# Patient Record
Sex: Male | Born: 1935 | Race: White | Hispanic: No | Smoking: Never smoker
Health system: Southern US, Community
[De-identification: ages and names within clinical notes are randomized; demographics above are authoritative.]

## PROBLEM LIST (undated history)

## (undated) DIAGNOSIS — I4891 Unspecified atrial fibrillation: Secondary | ICD-10-CM

## (undated) DIAGNOSIS — F0390 Unspecified dementia without behavioral disturbance: Secondary | ICD-10-CM

## (undated) DIAGNOSIS — I639 Cerebral infarction, unspecified: Secondary | ICD-10-CM

## (undated) DIAGNOSIS — J449 Chronic obstructive pulmonary disease, unspecified: Secondary | ICD-10-CM

---

## 2000-10-08 ENCOUNTER — Ambulatory Visit (HOSPITAL_COMMUNITY): Admission: RE | Admit: 2000-10-08 | Discharge: 2000-10-09 | Payer: Self-pay | Admitting: Ophthalmology

## 2005-01-29 ENCOUNTER — Ambulatory Visit: Payer: Self-pay

## 2005-10-21 ENCOUNTER — Inpatient Hospital Stay: Payer: Self-pay | Admitting: Internal Medicine

## 2005-10-21 ENCOUNTER — Other Ambulatory Visit: Payer: Self-pay

## 2005-10-22 ENCOUNTER — Other Ambulatory Visit: Payer: Self-pay

## 2005-12-07 ENCOUNTER — Ambulatory Visit: Payer: Self-pay | Admitting: Internal Medicine

## 2006-02-13 ENCOUNTER — Ambulatory Visit: Payer: Self-pay | Admitting: Unknown Physician Specialty

## 2006-06-04 ENCOUNTER — Ambulatory Visit: Payer: Self-pay | Admitting: Internal Medicine

## 2006-12-09 ENCOUNTER — Ambulatory Visit: Payer: Self-pay | Admitting: Internal Medicine

## 2007-03-18 ENCOUNTER — Ambulatory Visit: Payer: Self-pay | Admitting: Unknown Physician Specialty

## 2007-05-05 ENCOUNTER — Ambulatory Visit: Payer: Self-pay | Admitting: Specialist

## 2007-09-12 ENCOUNTER — Ambulatory Visit: Payer: Self-pay | Admitting: Internal Medicine

## 2007-09-15 ENCOUNTER — Ambulatory Visit: Payer: Self-pay | Admitting: Specialist

## 2007-09-19 ENCOUNTER — Ambulatory Visit: Payer: Self-pay | Admitting: Specialist

## 2007-10-08 ENCOUNTER — Ambulatory Visit: Payer: Self-pay | Admitting: Cardiothoracic Surgery

## 2007-10-17 ENCOUNTER — Ambulatory Visit: Payer: Self-pay | Admitting: General Surgery

## 2007-10-17 ENCOUNTER — Other Ambulatory Visit: Payer: Self-pay

## 2007-10-22 ENCOUNTER — Inpatient Hospital Stay: Payer: Self-pay | Admitting: General Surgery

## 2007-11-17 ENCOUNTER — Ambulatory Visit: Payer: Self-pay | Admitting: Cardiology

## 2008-02-01 ENCOUNTER — Emergency Department: Payer: Self-pay | Admitting: Unknown Physician Specialty

## 2008-02-02 ENCOUNTER — Other Ambulatory Visit: Payer: Self-pay

## 2008-02-02 ENCOUNTER — Ambulatory Visit: Payer: Self-pay | Admitting: Internal Medicine

## 2008-03-16 ENCOUNTER — Ambulatory Visit: Payer: Self-pay | Admitting: Internal Medicine

## 2008-09-03 ENCOUNTER — Ambulatory Visit: Payer: Self-pay | Admitting: Unknown Physician Specialty

## 2008-09-17 ENCOUNTER — Ambulatory Visit: Payer: Self-pay | Admitting: Internal Medicine

## 2008-11-10 ENCOUNTER — Ambulatory Visit: Payer: Self-pay | Admitting: Unknown Physician Specialty

## 2009-02-16 ENCOUNTER — Ambulatory Visit: Payer: Self-pay | Admitting: Internal Medicine

## 2009-03-01 ENCOUNTER — Ambulatory Visit: Payer: Self-pay | Admitting: Vascular Surgery

## 2009-08-22 ENCOUNTER — Ambulatory Visit: Payer: Self-pay | Admitting: Internal Medicine

## 2010-03-02 ENCOUNTER — Ambulatory Visit: Payer: Self-pay | Admitting: Internal Medicine

## 2010-06-23 ENCOUNTER — Ambulatory Visit: Payer: Self-pay | Admitting: Specialist

## 2010-10-18 ENCOUNTER — Ambulatory Visit: Payer: Self-pay | Admitting: Specialist

## 2010-11-10 ENCOUNTER — Inpatient Hospital Stay: Payer: Self-pay | Admitting: Internal Medicine

## 2011-05-29 ENCOUNTER — Ambulatory Visit: Payer: Self-pay | Admitting: Specialist

## 2011-05-30 ENCOUNTER — Ambulatory Visit: Payer: Self-pay | Admitting: Internal Medicine

## 2011-07-19 ENCOUNTER — Emergency Department: Payer: Self-pay | Admitting: Emergency Medicine

## 2012-01-28 ENCOUNTER — Ambulatory Visit: Payer: Self-pay | Admitting: Internal Medicine

## 2012-04-23 ENCOUNTER — Encounter (HOSPITAL_COMMUNITY): Payer: Self-pay | Admitting: *Deleted

## 2012-04-23 ENCOUNTER — Emergency Department (HOSPITAL_COMMUNITY)
Admission: EM | Admit: 2012-04-23 | Discharge: 2012-04-23 | Disposition: A | Discharge status: Home / Self Care | Payer: Medicare Other | Attending: Emergency Medicine | Admitting: Emergency Medicine

## 2012-04-23 ENCOUNTER — Emergency Department (HOSPITAL_COMMUNITY): Payer: Medicare Other

## 2012-04-23 DIAGNOSIS — W230XXA Caught, crushed, jammed, or pinched between moving objects, initial encounter: Secondary | ICD-10-CM | POA: Insufficient documentation

## 2012-04-23 DIAGNOSIS — S51819A Laceration without foreign body of unspecified forearm, initial encounter: Secondary | ICD-10-CM

## 2012-04-23 DIAGNOSIS — S61209A Unspecified open wound of unspecified finger without damage to nail, initial encounter: Secondary | ICD-10-CM | POA: Insufficient documentation

## 2012-04-23 DIAGNOSIS — S61419A Laceration without foreign body of unspecified hand, initial encounter: Secondary | ICD-10-CM

## 2012-04-23 DIAGNOSIS — S51809A Unspecified open wound of unspecified forearm, initial encounter: Secondary | ICD-10-CM | POA: Insufficient documentation

## 2012-04-23 HISTORY — DX: Unspecified atrial fibrillation: I48.91

## 2012-04-23 LAB — PROTIME-INR
INR: 1 (ref 0.00–1.49)
Prothrombin Time: 13.4 seconds (ref 11.6–15.2)

## 2012-04-23 MED ORDER — HYDROCODONE-ACETAMINOPHEN 5-500 MG PO TABS: 1.0000 | ORAL_TABLET | Freq: Four times a day (QID) | ORAL | Status: AC | PRN

## 2012-04-23 MED ORDER — TETANUS-DIPHTH-ACELL PERTUSSIS 5-2.5-18.5 LF-MCG/0.5 IM SUSP: 0.5000 mL | Freq: Once | INTRAMUSCULAR | Status: DC

## 2012-04-23 MED ORDER — HYDROCODONE-ACETAMINOPHEN 5-325 MG PO TABS
1.0000 | ORAL_TABLET | Freq: Once | ORAL | Status: AC
Start: 2012-04-23 — End: 2012-04-23
  Administered 2012-04-23: 1 via ORAL
  Filled 2012-04-23: qty 1

## 2012-04-23 MED ORDER — CEPHALEXIN 500 MG PO CAPS: 500.0000 mg | ORAL_CAPSULE | Freq: Four times a day (QID) | ORAL | Status: AC

## 2012-04-23 NOTE — ED Notes (Signed)
PT returned from xray; wounds irrigated.

## 2012-04-23 NOTE — ED Notes (Signed)
Pt ambulated with a steady gait;VSS; A&Ox3; no signs of distress; respirations even and unlabored; skin warm and dry; no questions at this time.  

## 2012-04-23 NOTE — ED Provider Notes (Signed)
History     CSN: [id number]  Arrival date & time 04/23/12  2130   First MD Initiated Contact with Patient 04/23/12 1947      Chief Complaint  Patient presents with  . Hand Injury    (Consider location/radiation/quality/duration/timing/severity/associated sxs/prior treatment) HPI Comments: Hand in lawnmower.  On coumadin.  Otherwise doing well.  No other injury.  Patient is a 78 y.o. male presenting with hand injury. The history is provided by the patient.  Hand Injury  The incident occurred less than 1 hour ago. The incident occurred at home. Injury mechanism: hand caught under lawnmower while pt was starting mower. Pain location: right hand, forearm. The pain is moderate. The pain has been constant since the incident. Pertinent negatives include no fever.    Past Medical History  Diagnosis Date  . A-fib     History reviewed. No pertinent past surgical history.  History reviewed. No pertinent family history.  History  Substance Use Topics  . Smoking status: Not on file  . Smokeless tobacco: Not on file  . Alcohol Use:       Review of Systems  Constitutional: Negative for fever, activity change and fatigue.  HENT: Negative for congestion.   Eyes: Negative for pain.  Respiratory: Negative for chest tightness, shortness of breath, wheezing and stridor.   Cardiovascular: Negative for chest pain and leg swelling.  Genitourinary: Negative for dysuria.  Musculoskeletal: Negative for arthralgias.  Skin: Negative for rash.  Neurological: Negative for headaches.  Psychiatric/Behavioral: Negative for behavioral problems.    Allergies  Review of patient's allergies indicates no known allergies.  Home Medications   Current Outpatient Rx  Name Route Sig Dispense Refill  . ACETAMINOPHEN 500 MG PO TABS Oral Take 1,000 mg by mouth at bedtime as needed. For pain    . ALBUTEROL SULFATE HFA 108 (90 BASE) MCG/ACT IN AERS Inhalation Inhale 2 puffs into the lungs every 4 (four)  hours as needed. For shortness of breath    . ASPIRIN EC 81 MG PO TBEC Oral Take 81 mg by mouth daily.    . ATORVASTATIN CALCIUM 80 MG PO TABS Oral Take 40 mg by mouth daily.    Marland Kitchen DIGOXIN 0.125 MG PO TABS Oral Take 125 mcg by mouth daily.    Marland Kitchen DOXYCYCLINE MONOHYDRATE 100 MG PO TABS Oral Take 100 mg by mouth 2 (two) times daily.    Marland Kitchen FLUTICASONE PROPIONATE 50 MCG/ACT NA SUSP Nasal Place 2 sprays into the nose daily.    Marland Kitchen FLUTICASONE-SALMETEROL 250-50 MCG/DOSE IN AEPB Inhalation Inhale 1 puff into the lungs every 12 (twelve) hours.    . FUROSEMIDE 20 MG PO TABS Oral Take 20 mg by mouth daily as needed. For fluid retention    . HYDROCODONE-ACETAMINOPHEN 5-500 MG PO TABS Oral Take 1 tablet by mouth every 8 (eight) hours as needed. For pain    . LISINOPRIL 20 MG PO TABS Oral Take 20 mg by mouth daily.    Marland Kitchen OMEPRAZOLE 20 MG PO CPDR Oral Take 20 mg by mouth 2 (two) times daily.    Marland Kitchen POTASSIUM CHLORIDE CRYS ER 20 MEQ PO TBCR Oral Take 20 mEq by mouth daily as needed. When taking furosemide    . THEOPHYLLINE ER 300 MG PO TB12 Oral Take 300 mg by mouth 2 (two) times daily.    Marland Kitchen TIOTROPIUM BROMIDE MONOHYDRATE 18 MCG IN CAPS Inhalation Place 18 mcg into inhaler and inhale daily.    . TRAMADOL HCL 50 MG PO TABS  Oral Take 50 mg by mouth every 8 (eight) hours as needed. For pain    . WARFARIN SODIUM 3 MG PO TABS Oral Take 3 mg by mouth daily.    . CEPHALEXIN 500 MG PO CAPS Oral Take 1 capsule (500 mg total) by mouth 4 (four) times daily. 28 capsule 0  . HYDROCODONE-ACETAMINOPHEN 5-500 MG PO TABS Oral Take 1 tablet by mouth every 6 (six) hours as needed for pain. 15 tablet 0    BP 145/77  Pulse 74  Temp(Src) 98.9 F (37.2 C) (Oral)  Resp 17  SpO2 100%  Physical Exam  Constitutional: He is oriented to person, place, and time. He appears well-developed and well-nourished. No distress.  HENT:  Head: Normocephalic and atraumatic.  Eyes: Conjunctivae and EOM are normal. Pupils are equal, round, and  reactive to light. No scleral icterus.  Neck: Normal range of motion. Neck supple.  Pulmonary/Chest: Effort normal.  Abdominal: Soft. He exhibits no distension and no mass. There is no tenderness. There is no rebound and no guarding.  Musculoskeletal: Normal range of motion. He exhibits no edema and no tenderness.       Lac right middle finger, right 2nd finger.  Skin tears to right forearm.  Full Hand ROM.  Sensation intact throughout  Neurological: He is alert and oriented to person, place, and time. He has normal reflexes. No cranial nerve deficit. He exhibits normal muscle tone. Coordination normal.  Skin: Skin is warm and dry. No rash noted. He is not diaphoretic. No erythema.  Psychiatric: He has a normal mood and affect. His behavior is normal. Judgment and thought content normal.    ED Course  LACERATION REPAIR Date/Time: 04/23/2012 11:00 PM Performed by: Army Chaco Authorized by: Hurman Horn Consent: Verbal consent obtained. Risks and benefits: risks, benefits and alternatives were discussed Body area: upper extremity Location details: right long finger Laceration length: 5 cm Foreign body present: grass, dirt. Tendon involvement: none Nerve involvement: none Vascular damage: no Anesthesia: local infiltration Local anesthetic: lidocaine 1% without epinephrine (digital block) Irrigation solution: saline Irrigation method: jet lavage Amount of cleaning: extensive Debridement: moderate Degree of undermining: minimal Skin closure: 4-0 Prolene Number of sutures: 12 Technique: simple Approximation: close Approximation difficulty: simple Dressing: antibiotic ointment, gauze packing and 4x4 sterile gauze Patient tolerance: Patient tolerated the procedure well with no immediate complications.   (including critical care time)   Labs Reviewed  PROTIME-INR   Dg Hand Complete Right  04/23/2012  *RADIOLOGY REPORT*  Clinical Data: Multiple lacerations.  RIGHT HAND -  COMPLETE 3+ VIEW  Comparison: None.  Findings: No acute osseous abnormality.  Degenerative changes are seen at the first carpometacarpal and scaphoid trapezium trapezoid joints.  No radiopaque foreign body.  IMPRESSION:  1.  No fracture or radiopaque foreign body. 2.  Mild degenerative changes in the wrist.  Original Report Authenticated By: Reyes Ivan, M.D.     1. Laceration of hand   2. Skin tear of forearm without complication       MDM  Hand in lawnmower.  On coumadin.  Otherwise doing well.  No other injury.  N/V intact with full ROm in affected hand.  Xray without fx.  Lac repaired with sutures.  Skin tears closed with steristrips/gauze.  Pt to keep strips in place x 3 days.  PCP in 10 days for suture removal.  Keflex for prophylaxis.  Finger splint to allow for healing.  Pt comfortable with plan and will follow up.  Army Chaco, MD 04/23/12 2302

## 2012-04-23 NOTE — Discharge Instructions (Signed)

## 2012-04-23 NOTE — ED Notes (Signed)
Pt from home.  Has a hand injury from a lawnmower.  (R) hand injury and (R) FA injury.  Pt on blood thinners, bleeding controlled.  (R) middle and ring finger has lac to bone, sensation and movement presents.  Cap refill <3.  Vitals stable.  20Lwrist.

## 2012-04-23 NOTE — ED Notes (Signed)
Patient transported to X-ray 

## 2012-04-23 NOTE — ED Notes (Signed)
MD at bedside suturing finger lac.

## 2012-04-23 NOTE — ED Notes (Signed)
Ortho called 

## 2012-04-23 NOTE — Progress Notes (Signed)
Orthopedic Tech Progress Note Patient Details:  Troy Gonzales 16-Sep-1923 [id number]  Type of Splint: Finger Splint Location: right hand Splint Interventions: Application    [name] 04/23/2012, 11:07 PM

## 2012-04-26 NOTE — ED Provider Notes (Signed)
I saw and evaluated the patient, reviewed the resident's note and I agree with the findings and plan.  No weakness long finger extension against resistance.  Hurman Horn, MD 04/26/12 [phone]

## 2012-04-27 ENCOUNTER — Emergency Department: Payer: Self-pay | Admitting: *Deleted

## 2012-08-26 ENCOUNTER — Ambulatory Visit: Payer: Self-pay | Admitting: Internal Medicine

## 2013-01-15 ENCOUNTER — Ambulatory Visit: Payer: Self-pay

## 2013-02-02 LAB — CBC: RBC: 4.66 10*6/uL (ref 4.40–5.90)

## 2013-02-02 LAB — BASIC METABOLIC PANEL
Co2: 30 mmol/L (ref 21–32)
Osmolality: 288 (ref 275–301)
Potassium: 4.3 mmol/L (ref 3.5–5.1)

## 2013-02-02 LAB — PROTIME-INR
INR: 1
Prothrombin Time: 13.2 secs (ref 11.5–14.7)

## 2013-02-03 ENCOUNTER — Observation Stay: Payer: Self-pay | Admitting: Internal Medicine

## 2013-02-04 LAB — CBC WITH DIFFERENTIAL/PLATELET
Basophil %: 1 %
Eosinophil %: 3.1 %
HCT: 35.7 % — ABNORMAL LOW (ref 40.0–52.0)
HGB: 11.5 g/dL — ABNORMAL LOW (ref 13.0–18.0)
Lymphocyte %: 21.9 %
MCHC: 32.1 g/dL (ref 32.0–36.0)
MCV: 84 fL (ref 80–100)
Neutrophil %: 67.1 %
WBC: 7 10*3/uL (ref 3.8–10.6)

## 2013-02-05 ENCOUNTER — Ambulatory Visit: Payer: Self-pay | Admitting: Internal Medicine

## 2013-03-31 ENCOUNTER — Ambulatory Visit: Payer: Self-pay | Admitting: Vascular Surgery

## 2013-03-31 LAB — PROTIME-INR: Prothrombin Time: 13.4 secs (ref 11.5–14.7)

## 2013-03-31 LAB — BASIC METABOLIC PANEL
Chloride: 107 mmol/L (ref 98–107)
Glucose: 95 mg/dL (ref 65–99)
Osmolality: 276 (ref 275–301)

## 2013-09-27 LAB — COMPREHENSIVE METABOLIC PANEL
Albumin: 3.1 g/dL — ABNORMAL LOW (ref 3.4–5.0)
Alkaline Phosphatase: 133 U/L (ref 50–136)
Bilirubin,Total: 2.2 mg/dL — ABNORMAL HIGH (ref 0.2–1.0)
Creatinine: 1.13 mg/dL (ref 0.60–1.30)
EGFR (African American): >60
Glucose: 89 mg/dL (ref 65–99)
Potassium: 4.7 mmol/L (ref 3.5–5.1)
SGPT (ALT): 14 U/L (ref 12–78)

## 2013-09-27 LAB — URINALYSIS, COMPLETE
Ketone: NEGATIVE
Leukocyte Esterase: NEGATIVE
Protein: NEGATIVE
RBC,UR: 1 /HPF (ref 0–5)
Squamous Epithelial: NONE SEEN

## 2013-09-27 LAB — CBC
HCT: 35.8 % — ABNORMAL LOW (ref 40.0–52.0)
MCH: 27.2 pg (ref 26.0–34.0)
MCV: 83 fL (ref 80–100)
Platelet: 158 10*3/uL (ref 150–440)
RBC: 4.33 10*6/uL — ABNORMAL LOW (ref 4.40–5.90)
WBC: 5.1 10*3/uL (ref 3.8–10.6)

## 2013-09-27 LAB — CK TOTAL AND CKMB (NOT AT ARMC): CK-MB: 1.9 ng/mL (ref 0.5–3.6)

## 2013-09-27 LAB — TROPONIN I: Troponin-I: 0.3 ng/mL — ABNORMAL HIGH

## 2013-09-28 ENCOUNTER — Inpatient Hospital Stay: Payer: Self-pay | Admitting: Internal Medicine

## 2013-09-28 LAB — LIPID PANEL
Cholesterol: 125 mg/dL (ref 0–200)
Ldl Cholesterol, Calc: 67 mg/dL (ref 0–100)
Triglycerides: 61 mg/dL (ref 0–200)

## 2013-09-28 LAB — BASIC METABOLIC PANEL
BUN: 11 mg/dL (ref 7–18)
Calcium, Total: 9 mg/dL (ref 8.5–10.1)
Creatinine: 0.94 mg/dL (ref 0.60–1.30)
EGFR (African American): >60
Glucose: 77 mg/dL (ref 65–99)
Osmolality: 276 (ref 275–301)
Potassium: 4.2 mmol/L (ref 3.5–5.1)

## 2013-09-28 LAB — TROPONIN I: Troponin-I: 0.34 ng/mL — ABNORMAL HIGH

## 2013-09-28 LAB — CBC WITH DIFFERENTIAL/PLATELET
Basophil %: 1 %
Eosinophil %: 14.9 %
HCT: 37.2 % — ABNORMAL LOW (ref 40.0–52.0)
MCH: 26.9 pg (ref 26.0–34.0)
Monocyte %: 10.9 %
Neutrophil %: 54.5 %
Platelet: 150 10*3/uL (ref 150–440)
RBC: 4.45 10*6/uL (ref 4.40–5.90)
RDW: 15.4 % — ABNORMAL HIGH (ref 11.5–14.5)
WBC: 5.9 10*3/uL (ref 3.8–10.6)

## 2013-09-29 LAB — CBC WITH DIFFERENTIAL/PLATELET
Eosinophil #: 0.5 10*3/uL (ref 0.0–0.7)
Eosinophil %: 10.8 %
HCT: 35 % — ABNORMAL LOW (ref 40.0–52.0)
MCH: 27.4 pg (ref 26.0–34.0)
MCHC: 33.3 g/dL (ref 32.0–36.0)
Monocyte #: 0.5 x10 3/mm (ref 0.2–1.0)
Neutrophil #: 3.1 10*3/uL (ref 1.4–6.5)
Neutrophil %: 60.3 %
Platelet: 150 10*3/uL (ref 150–440)
WBC: 5.1 10*3/uL (ref 3.8–10.6)

## 2013-09-29 LAB — BASIC METABOLIC PANEL
BUN: 16 mg/dL (ref 7–18)
Chloride: 106 mmol/L (ref 98–107)
Creatinine: 1.15 mg/dL (ref 0.60–1.30)
EGFR (African American): >60
EGFR (Non-African Amer.): 57 — ABNORMAL LOW
Potassium: 3.9 mmol/L (ref 3.5–5.1)

## 2013-09-30 LAB — CBC WITH DIFFERENTIAL/PLATELET
Basophil #: 0.1 10*3/uL (ref 0.0–0.1)
Basophil %: 1 %
Eosinophil #: 0.5 10*3/uL (ref 0.0–0.7)
Eosinophil %: 10.3 %
Lymphocyte #: 1.1 10*3/uL (ref 1.0–3.6)
Lymphocyte %: 20.1 %
MCHC: 32.5 g/dL (ref 32.0–36.0)
MCV: 83 fL (ref 80–100)
Monocyte #: 0.5 x10 3/mm (ref 0.2–1.0)
Monocyte %: 8.8 %
Neutrophil #: 3.2 10*3/uL (ref 1.4–6.5)
RDW: 15.6 % — ABNORMAL HIGH (ref 11.5–14.5)

## 2013-09-30 LAB — BASIC METABOLIC PANEL
Anion Gap: 3 — ABNORMAL LOW (ref 7–16)
Chloride: 109 mmol/L — ABNORMAL HIGH (ref 98–107)
EGFR (African American): >60
EGFR (Non-African Amer.): 60 — ABNORMAL LOW
Glucose: 94 mg/dL (ref 65–99)
Sodium: 139 mmol/L (ref 136–145)

## 2014-01-07 ENCOUNTER — Emergency Department: Payer: Self-pay | Admitting: Emergency Medicine

## 2014-01-07 LAB — CBC
HCT: 41.9 % (ref 40.0–52.0)
HGB: 13.2 g/dL (ref 13.0–18.0)
MCH: 26.6 pg (ref 26.0–34.0)
MCHC: 31.5 g/dL — ABNORMAL LOW (ref 32.0–36.0)
MCV: 85 fL (ref 80–100)
Platelet: 180 10*3/uL (ref 150–440)
RBC: 4.96 10*6/uL (ref 4.40–5.90)
RDW: 16 % — ABNORMAL HIGH (ref 11.5–14.5)
WBC: 4.4 10*3/uL (ref 3.8–10.6)

## 2014-01-07 LAB — BASIC METABOLIC PANEL
ANION GAP: 3 — AB (ref 7–16)
BUN: 17 mg/dL (ref 7–18)
Calcium, Total: 9.1 mg/dL (ref 8.5–10.1)
Chloride: 105 mmol/L (ref 98–107)
Co2: 30 mmol/L (ref 21–32)
Creatinine: 1.15 mg/dL (ref 0.60–1.30)
EGFR (African American): >60
EGFR (Non-African Amer.): 57 — ABNORMAL LOW
Glucose: 112 mg/dL — ABNORMAL HIGH (ref 65–99)
Osmolality: 278 (ref 275–301)
Potassium: 4.5 mmol/L (ref 3.5–5.1)
SODIUM: 138 mmol/L (ref 136–145)

## 2014-04-30 ENCOUNTER — Inpatient Hospital Stay: Payer: Self-pay | Admitting: Internal Medicine

## 2014-04-30 LAB — CBC
HCT: 38.6 % — ABNORMAL LOW (ref 40.0–52.0)
HGB: 12.2 g/dL — AB (ref 13.0–18.0)
MCH: 26.9 pg (ref 26.0–34.0)
MCHC: 31.7 g/dL — ABNORMAL LOW (ref 32.0–36.0)
MCV: 85 fL (ref 80–100)
PLATELETS: 161 10*3/uL (ref 150–440)
RBC: 4.54 10*6/uL (ref 4.40–5.90)
RDW: 16.3 % — ABNORMAL HIGH (ref 11.5–14.5)
WBC: 6.3 10*3/uL (ref 3.8–10.6)

## 2014-04-30 LAB — URINALYSIS, COMPLETE
BILIRUBIN, UR: NEGATIVE
BLOOD: NEGATIVE
Bacteria: NONE SEEN
GLUCOSE, UR: NEGATIVE mg/dL (ref 0–75)
KETONE: NEGATIVE
LEUKOCYTE ESTERASE: NEGATIVE
Nitrite: NEGATIVE
Ph: 7 (ref 4.5–8.0)
Protein: NEGATIVE
RBC,UR: 1 /HPF (ref 0–5)
Specific Gravity: 1.011 (ref 1.003–1.030)
Squamous Epithelial: NONE SEEN

## 2014-04-30 LAB — COMPREHENSIVE METABOLIC PANEL
ALK PHOS: 114 U/L
ALT: 11 U/L — AB (ref 12–78)
ANION GAP: 4 — AB (ref 7–16)
AST: 27 U/L (ref 15–37)
Albumin: 3.4 g/dL (ref 3.4–5.0)
BUN: 12 mg/dL (ref 7–18)
Bilirubin,Total: 2.1 mg/dL — ABNORMAL HIGH (ref 0.2–1.0)
Calcium, Total: 9 mg/dL (ref 8.5–10.1)
Chloride: 105 mmol/L (ref 98–107)
Co2: 29 mmol/L (ref 21–32)
Creatinine: 1.04 mg/dL (ref 0.60–1.30)
GLUCOSE: 93 mg/dL (ref 65–99)
OSMOLALITY: 275 (ref 275–301)
POTASSIUM: 3.8 mmol/L (ref 3.5–5.1)
Sodium: 138 mmol/L (ref 136–145)
Total Protein: 7 g/dL (ref 6.4–8.2)

## 2014-04-30 LAB — CK TOTAL AND CKMB (NOT AT ARMC)
CK, TOTAL: 92 U/L
CK, TOTAL: 117 U/L
CK, Total: 97 U/L
CK-MB: 2.8 ng/mL (ref 0.5–3.6)
CK-MB: 2.9 ng/mL (ref 0.5–3.6)
CK-MB: 3.1 ng/mL (ref 0.5–3.6)

## 2014-04-30 LAB — TROPONIN I
TROPONIN-I: 0.23 ng/mL — AB
Troponin-I: 0.24 ng/mL — ABNORMAL HIGH
Troponin-I: 0.21 ng/mL — ABNORMAL HIGH

## 2014-04-30 LAB — PRO B NATRIURETIC PEPTIDE: B-TYPE NATIURETIC PEPTID: 4013 pg/mL — AB (ref 0–450)

## 2014-05-02 LAB — BASIC METABOLIC PANEL
ANION GAP: 0 — AB (ref 7–16)
BUN: 14 mg/dL (ref 7–18)
Calcium, Total: 9.1 mg/dL (ref 8.5–10.1)
Chloride: 106 mmol/L (ref 98–107)
Co2: 32 mmol/L (ref 21–32)
Creatinine: 1.05 mg/dL (ref 0.60–1.30)
EGFR (African American): >60
Glucose: 94 mg/dL (ref 65–99)
Osmolality: 276 (ref 275–301)
Potassium: 4 mmol/L (ref 3.5–5.1)
SODIUM: 138 mmol/L (ref 136–145)

## 2014-05-02 LAB — CBC WITH DIFFERENTIAL/PLATELET
Basophil #: 0 10*3/uL (ref 0.0–0.1)
Basophil %: 0.6 %
EOS PCT: 6.6 %
Eosinophil #: 0.3 10*3/uL (ref 0.0–0.7)
HCT: 37.2 % — AB (ref 40.0–52.0)
HGB: 11.7 g/dL — ABNORMAL LOW (ref 13.0–18.0)
LYMPHS ABS: 0.9 10*3/uL — AB (ref 1.0–3.6)
LYMPHS PCT: 17 %
MCH: 26.8 pg (ref 26.0–34.0)
MCHC: 31.4 g/dL — AB (ref 32.0–36.0)
MCV: 85 fL (ref 80–100)
MONOS PCT: 9.6 %
Monocyte #: 0.5 x10 3/mm (ref 0.2–1.0)
NEUTROS ABS: 3.5 10*3/uL (ref 1.4–6.5)
Neutrophil %: 66.2 %
Platelet: 145 10*3/uL — ABNORMAL LOW (ref 150–440)
RBC: 4.36 10*6/uL — AB (ref 4.40–5.90)
RDW: 16 % — AB (ref 11.5–14.5)
WBC: 5.3 10*3/uL (ref 3.8–10.6)

## 2014-05-03 LAB — BASIC METABOLIC PANEL
Anion Gap: 5 — ABNORMAL LOW (ref 7–16)
BUN: 18 mg/dL (ref 7–18)
CALCIUM: 9.3 mg/dL (ref 8.5–10.1)
CHLORIDE: 104 mmol/L (ref 98–107)
Co2: 29 mmol/L (ref 21–32)
Creatinine: 0.95 mg/dL (ref 0.60–1.30)
EGFR (Non-African Amer.): >60
GLUCOSE: 90 mg/dL (ref 65–99)
Osmolality: 277 (ref 275–301)
POTASSIUM: 3.9 mmol/L (ref 3.5–5.1)
SODIUM: 138 mmol/L (ref 136–145)

## 2014-05-03 LAB — CBC WITH DIFFERENTIAL/PLATELET
Basophil #: 0.1 10*3/uL (ref 0.0–0.1)
Basophil %: 1 %
EOS ABS: 0.4 10*3/uL (ref 0.0–0.7)
Eosinophil %: 6.9 %
HCT: 35.3 % — AB (ref 40.0–52.0)
HGB: 11 g/dL — ABNORMAL LOW (ref 13.0–18.0)
Lymphocyte #: 0.9 10*3/uL — ABNORMAL LOW (ref 1.0–3.6)
Lymphocyte %: 16.1 %
MCH: 26.4 pg (ref 26.0–34.0)
MCHC: 31.1 g/dL — AB (ref 32.0–36.0)
MCV: 85 fL (ref 80–100)
Monocyte #: 0.6 x10 3/mm (ref 0.2–1.0)
Monocyte %: 11.9 %
NEUTROS ABS: 3.4 10*3/uL (ref 1.4–6.5)
NEUTROS PCT: 64.1 %
Platelet: 152 10*3/uL (ref 150–440)
RBC: 4.16 10*6/uL — AB (ref 4.40–5.90)
RDW: 16.1 % — ABNORMAL HIGH (ref 11.5–14.5)
WBC: 5.3 10*3/uL (ref 3.8–10.6)

## 2014-05-05 LAB — BASIC METABOLIC PANEL
Anion Gap: 7 (ref 7–16)
BUN: 13 mg/dL (ref 7–18)
CO2: 25 mmol/L (ref 21–32)
CREATININE: 0.74 mg/dL (ref 0.60–1.30)
Calcium, Total: 9.3 mg/dL (ref 8.5–10.1)
Chloride: 106 mmol/L (ref 98–107)
EGFR (African American): >60
EGFR (Non-African Amer.): >60
GLUCOSE: 93 mg/dL (ref 65–99)
OSMOLALITY: 275 (ref 275–301)
Potassium: 4.2 mmol/L (ref 3.5–5.1)
SODIUM: 138 mmol/L (ref 136–145)

## 2014-05-05 LAB — CBC WITH DIFFERENTIAL/PLATELET
BASOS ABS: 0.1 10*3/uL (ref 0.0–0.1)
Basophil %: 0.9 %
EOS ABS: 0.5 10*3/uL (ref 0.0–0.7)
Eosinophil %: 9.1 %
HCT: 35.1 % — AB (ref 40.0–52.0)
HGB: 10.9 g/dL — ABNORMAL LOW (ref 13.0–18.0)
LYMPHS ABS: 1.1 10*3/uL (ref 1.0–3.6)
Lymphocyte %: 19.6 %
MCH: 26.6 pg (ref 26.0–34.0)
MCHC: 31.2 g/dL — AB (ref 32.0–36.0)
MCV: 85 fL (ref 80–100)
MONO ABS: 0.5 x10 3/mm (ref 0.2–1.0)
MONOS PCT: 9.5 %
Neutrophil #: 3.4 10*3/uL (ref 1.4–6.5)
Neutrophil %: 60.9 %
Platelet: 169 10*3/uL (ref 150–440)
RBC: 4.12 10*6/uL — ABNORMAL LOW (ref 4.40–5.90)
RDW: 16.1 % — ABNORMAL HIGH (ref 11.5–14.5)
WBC: 5.6 10*3/uL (ref 3.8–10.6)

## 2014-05-06 LAB — CBC WITH DIFFERENTIAL/PLATELET
BASOS PCT: 1.1 %
Basophil #: 0 10*3/uL (ref 0.0–0.1)
Eosinophil #: 0.6 10*3/uL (ref 0.0–0.7)
Eosinophil %: 13.3 %
HCT: 34 % — ABNORMAL LOW (ref 40.0–52.0)
HGB: 10.6 g/dL — ABNORMAL LOW (ref 13.0–18.0)
LYMPHS PCT: 21.8 %
Lymphocyte #: 0.9 10*3/uL — ABNORMAL LOW (ref 1.0–3.6)
MCH: 26.6 pg (ref 26.0–34.0)
MCHC: 31.1 g/dL — ABNORMAL LOW (ref 32.0–36.0)
MCV: 85 fL (ref 80–100)
Monocyte #: 0.5 x10 3/mm (ref 0.2–1.0)
Monocyte %: 10.8 %
NEUTROS ABS: 2.2 10*3/uL (ref 1.4–6.5)
Neutrophil %: 53 %
Platelet: 155 10*3/uL (ref 150–440)
RBC: 3.98 10*6/uL — AB (ref 4.40–5.90)
RDW: 16.2 % — ABNORMAL HIGH (ref 11.5–14.5)
WBC: 4.2 10*3/uL (ref 3.8–10.6)

## 2014-05-06 LAB — BASIC METABOLIC PANEL
Anion Gap: 4 — ABNORMAL LOW (ref 7–16)
BUN: 15 mg/dL (ref 7–18)
CALCIUM: 9 mg/dL (ref 8.5–10.1)
CO2: 28 mmol/L (ref 21–32)
Chloride: 107 mmol/L (ref 98–107)
Creatinine: 0.94 mg/dL (ref 0.60–1.30)
GLUCOSE: 83 mg/dL (ref 65–99)
OSMOLALITY: 278 (ref 275–301)
Potassium: 4.3 mmol/L (ref 3.5–5.1)
Sodium: 139 mmol/L (ref 136–145)

## 2014-05-16 LAB — BASIC METABOLIC PANEL
ANION GAP: 6 — AB (ref 7–16)
BUN: 25 mg/dL — ABNORMAL HIGH (ref 7–18)
CHLORIDE: 105 mmol/L (ref 98–107)
CO2: 26 mmol/L (ref 21–32)
CREATININE: 1.28 mg/dL (ref 0.60–1.30)
Calcium, Total: 9.3 mg/dL (ref 8.5–10.1)
EGFR (African American): 57 — ABNORMAL LOW
GFR CALC NON AF AMER: 49 — AB
GLUCOSE: 121 mg/dL — AB (ref 65–99)
Osmolality: 279 (ref 275–301)
POTASSIUM: 4.8 mmol/L (ref 3.5–5.1)
SODIUM: 137 mmol/L (ref 136–145)

## 2014-05-16 LAB — CBC
HCT: 39.2 % — ABNORMAL LOW (ref 40.0–52.0)
HGB: 12 g/dL — AB (ref 13.0–18.0)
MCH: 26.3 pg (ref 26.0–34.0)
MCHC: 30.6 g/dL — AB (ref 32.0–36.0)
MCV: 86 fL (ref 80–100)
Platelet: 216 10*3/uL (ref 150–440)
RBC: 4.55 10*6/uL (ref 4.40–5.90)
RDW: 16 % — ABNORMAL HIGH (ref 11.5–14.5)
WBC: 5.8 10*3/uL (ref 3.8–10.6)

## 2014-05-16 LAB — TROPONIN I
TROPONIN-I: 0.2 ng/mL — AB
TROPONIN-I: 0.19 ng/mL — AB
Troponin-I: 0.15 ng/mL — ABNORMAL HIGH

## 2014-05-16 LAB — PRO B NATRIURETIC PEPTIDE: B-TYPE NATIURETIC PEPTID: 1652 pg/mL — AB (ref 0–450)

## 2014-05-16 LAB — TSH: THYROID STIMULATING HORM: 0.472 u[IU]/mL

## 2014-05-17 ENCOUNTER — Inpatient Hospital Stay: Payer: Self-pay | Admitting: Internal Medicine

## 2014-05-17 LAB — BASIC METABOLIC PANEL
Anion Gap: 4 — ABNORMAL LOW (ref 7–16)
BUN: 21 mg/dL — ABNORMAL HIGH (ref 7–18)
Calcium, Total: 9.2 mg/dL (ref 8.5–10.1)
Chloride: 107 mmol/L (ref 98–107)
Co2: 27 mmol/L (ref 21–32)
Creatinine: 1.02 mg/dL (ref 0.60–1.30)
EGFR (African American): >60
EGFR (Non-African Amer.): >60
Glucose: 83 mg/dL (ref 65–99)
Osmolality: 278 (ref 275–301)
Potassium: 4.7 mmol/L (ref 3.5–5.1)
Sodium: 138 mmol/L (ref 136–145)

## 2014-05-17 LAB — URINALYSIS, COMPLETE
Bilirubin,UR: NEGATIVE
Blood: NEGATIVE
Glucose,UR: NEGATIVE mg/dL (ref 0–75)
Ketone: NEGATIVE
Nitrite: NEGATIVE
Ph: 7 (ref 4.5–8.0)
Protein: NEGATIVE
RBC,UR: NONE SEEN /HPF (ref 0–5)
Specific Gravity: 1.008 (ref 1.003–1.030)
Squamous Epithelial: NONE SEEN
WBC UR: <1 /HPF (ref 0–5)

## 2014-05-17 LAB — CBC WITH DIFFERENTIAL/PLATELET
BASOS PCT: 0.9 %
Basophil #: 0 10*3/uL (ref 0.0–0.1)
Eosinophil #: 0.6 10*3/uL (ref 0.0–0.7)
Eosinophil %: 13.5 %
HCT: 36.6 % — ABNORMAL LOW (ref 40.0–52.0)
HGB: 11.3 g/dL — ABNORMAL LOW (ref 13.0–18.0)
Lymphocyte #: 1.1 10*3/uL (ref 1.0–3.6)
Lymphocyte %: 24.7 %
MCH: 26.6 pg (ref 26.0–34.0)
MCHC: 30.8 g/dL — ABNORMAL LOW (ref 32.0–36.0)
MCV: 87 fL (ref 80–100)
MONO ABS: 0.5 x10 3/mm (ref 0.2–1.0)
MONOS PCT: 10.5 %
NEUTROS ABS: 2.2 10*3/uL (ref 1.4–6.5)
Neutrophil %: 50.4 %
Platelet: 173 10*3/uL (ref 150–440)
RBC: 4.23 10*6/uL — ABNORMAL LOW (ref 4.40–5.90)
RDW: 15.9 % — ABNORMAL HIGH (ref 11.5–14.5)
WBC: 4.3 10*3/uL (ref 3.8–10.6)

## 2014-05-17 LAB — MAGNESIUM: Magnesium: 2 mg/dL

## 2014-05-18 LAB — CBC WITH DIFFERENTIAL/PLATELET
Basophil #: 0 10*3/uL (ref 0.0–0.1)
Basophil %: 0.8 %
EOS ABS: 0.7 10*3/uL (ref 0.0–0.7)
Eosinophil %: 14.4 %
HCT: 34.5 % — AB (ref 40.0–52.0)
HGB: 10.6 g/dL — ABNORMAL LOW (ref 13.0–18.0)
Lymphocyte #: 1.1 10*3/uL (ref 1.0–3.6)
Lymphocyte %: 23.8 %
MCH: 26.5 pg (ref 26.0–34.0)
MCHC: 30.8 g/dL — ABNORMAL LOW (ref 32.0–36.0)
MCV: 86 fL (ref 80–100)
Monocyte #: 0.5 x10 3/mm (ref 0.2–1.0)
Monocyte %: 9.9 %
NEUTROS ABS: 2.3 10*3/uL (ref 1.4–6.5)
Neutrophil %: 51.1 %
Platelet: 155 10*3/uL (ref 150–440)
RBC: 4.01 10*6/uL — AB (ref 4.40–5.90)
RDW: 16.2 % — ABNORMAL HIGH (ref 11.5–14.5)
WBC: 4.5 10*3/uL (ref 3.8–10.6)

## 2014-05-18 LAB — BASIC METABOLIC PANEL
Anion Gap: 1 — ABNORMAL LOW (ref 7–16)
BUN: 19 mg/dL — AB (ref 7–18)
CALCIUM: 8.9 mg/dL (ref 8.5–10.1)
CO2: 28 mmol/L (ref 21–32)
Chloride: 111 mmol/L — ABNORMAL HIGH (ref 98–107)
Creatinine: 0.99 mg/dL (ref 0.60–1.30)
EGFR (African American): >60
Glucose: 75 mg/dL (ref 65–99)
Osmolality: 280 (ref 275–301)
Potassium: 4.8 mmol/L (ref 3.5–5.1)
Sodium: 140 mmol/L (ref 136–145)

## 2014-05-19 LAB — BASIC METABOLIC PANEL
Anion Gap: 2 — ABNORMAL LOW (ref 7–16)
BUN: 17 mg/dL (ref 7–18)
Calcium, Total: 9 mg/dL (ref 8.5–10.1)
Chloride: 109 mmol/L — ABNORMAL HIGH (ref 98–107)
Co2: 29 mmol/L (ref 21–32)
Creatinine: 0.88 mg/dL (ref 0.60–1.30)
GLUCOSE: 77 mg/dL (ref 65–99)
OSMOLALITY: 280 (ref 275–301)
POTASSIUM: 4.7 mmol/L (ref 3.5–5.1)
SODIUM: 140 mmol/L (ref 136–145)

## 2014-05-19 LAB — CBC WITH DIFFERENTIAL/PLATELET
Basophil #: 0 10*3/uL (ref 0.0–0.1)
Basophil %: 1 %
EOS PCT: 15.4 %
Eosinophil #: 0.6 10*3/uL (ref 0.0–0.7)
HCT: 33.6 % — AB (ref 40.0–52.0)
HGB: 10.6 g/dL — ABNORMAL LOW (ref 13.0–18.0)
LYMPHS ABS: 1.1 10*3/uL (ref 1.0–3.6)
LYMPHS PCT: 25.9 %
MCH: 26.8 pg (ref 26.0–34.0)
MCHC: 31.5 g/dL — ABNORMAL LOW (ref 32.0–36.0)
MCV: 85 fL (ref 80–100)
MONO ABS: 0.4 x10 3/mm (ref 0.2–1.0)
MONOS PCT: 10.5 %
NEUTROS ABS: 1.9 10*3/uL (ref 1.4–6.5)
NEUTROS PCT: 47.2 %
Platelet: 148 10*3/uL — ABNORMAL LOW (ref 150–440)
RBC: 3.96 10*6/uL — ABNORMAL LOW (ref 4.40–5.90)
RDW: 16 % — ABNORMAL HIGH (ref 11.5–14.5)
WBC: 4.1 10*3/uL (ref 3.8–10.6)

## 2014-05-20 ENCOUNTER — Encounter: Payer: Self-pay | Admitting: Internal Medicine

## 2014-05-30 LAB — URINALYSIS, COMPLETE
Bacteria: NONE SEEN
Bilirubin,UR: NEGATIVE
Blood: NEGATIVE
Glucose,UR: NEGATIVE mg/dL (ref 0–75)
Ketone: NEGATIVE
Leukocyte Esterase: NEGATIVE
Nitrite: NEGATIVE
PH: 5 (ref 4.5–8.0)
PROTEIN: NEGATIVE
RBC,UR: 1 /HPF (ref 0–5)
Specific Gravity: 1.021 (ref 1.003–1.030)
Squamous Epithelial: <1

## 2014-06-01 LAB — URINE CULTURE

## 2014-07-07 ENCOUNTER — Ambulatory Visit: Payer: Self-pay | Admitting: Specialist

## 2014-12-10 ENCOUNTER — Inpatient Hospital Stay: Payer: Self-pay | Admitting: Internal Medicine

## 2014-12-10 ENCOUNTER — Ambulatory Visit: Payer: Self-pay | Admitting: Internal Medicine

## 2014-12-10 LAB — CBC
HCT: 40.7 % (ref 40.0–52.0)
HGB: 12.8 g/dL — AB (ref 13.0–18.0)
MCH: 27.8 pg (ref 26.0–34.0)
MCHC: 31.4 g/dL — AB (ref 32.0–36.0)
MCV: 89 fL (ref 80–100)
Platelet: 179 10*3/uL (ref 150–440)
RBC: 4.59 10*6/uL (ref 4.40–5.90)
RDW: 16.3 % — ABNORMAL HIGH (ref 11.5–14.5)
WBC: 6.2 10*3/uL (ref 3.8–10.6)

## 2014-12-10 LAB — CK-MB
CK-MB: 1.9 ng/mL (ref 0.5–3.6)
CK-MB: 1.4 ng/mL (ref 0.5–3.6)

## 2014-12-10 LAB — BASIC METABOLIC PANEL
ANION GAP: 1 — AB (ref 7–16)
BUN: 18 mg/dL (ref 7–18)
CREATININE: 1.03 mg/dL (ref 0.60–1.30)
Calcium, Total: 9.7 mg/dL (ref 8.5–10.1)
Chloride: 108 mmol/L — ABNORMAL HIGH (ref 98–107)
Co2: 32 mmol/L (ref 21–32)
EGFR (African American): >60
GLUCOSE: 124 mg/dL — AB (ref 65–99)
Osmolality: 285 (ref 275–301)
Potassium: 4.8 mmol/L (ref 3.5–5.1)
SODIUM: 141 mmol/L (ref 136–145)

## 2014-12-10 LAB — TROPONIN I
TROPONIN-I: 0.19 ng/mL — AB
Troponin-I: 0.19 ng/mL — ABNORMAL HIGH

## 2014-12-10 LAB — PRO B NATRIURETIC PEPTIDE: B-Type Natriuretic Peptide: 1904 pg/mL — ABNORMAL HIGH (ref 0–450)

## 2014-12-11 DIAGNOSIS — R079 Chest pain, unspecified: Secondary | ICD-10-CM

## 2014-12-11 LAB — LIPID PANEL
Cholesterol: 159 mg/dL (ref 0–200)
HDL Cholesterol: 60 mg/dL (ref 40–60)
LDL CHOLESTEROL, CALC: 87 mg/dL (ref 0–100)
Triglycerides: 59 mg/dL (ref 0–200)
VLDL Cholesterol, Calc: 12 mg/dL (ref 5–40)

## 2014-12-11 LAB — CBC WITH DIFFERENTIAL/PLATELET
BASOS PCT: 0.4 %
Basophil #: 0 10*3/uL (ref 0.0–0.1)
Eosinophil #: 0.9 10*3/uL — ABNORMAL HIGH (ref 0.0–0.7)
Eosinophil %: 11.5 %
HCT: 38.7 % — ABNORMAL LOW (ref 40.0–52.0)
HGB: 12.4 g/dL — AB (ref 13.0–18.0)
LYMPHS ABS: 1.7 10*3/uL (ref 1.0–3.6)
Lymphocyte %: 22.1 %
MCH: 28.2 pg (ref 26.0–34.0)
MCHC: 32 g/dL (ref 32.0–36.0)
MCV: 88 fL (ref 80–100)
MONOS PCT: 8.3 %
Monocyte #: 0.6 x10 3/mm (ref 0.2–1.0)
NEUTROS ABS: 4.4 10*3/uL (ref 1.4–6.5)
Neutrophil %: 57.7 %
Platelet: 170 10*3/uL (ref 150–440)
RBC: 4.39 10*6/uL — ABNORMAL LOW (ref 4.40–5.90)
RDW: 16.1 % — ABNORMAL HIGH (ref 11.5–14.5)
WBC: 7.7 10*3/uL (ref 3.8–10.6)

## 2014-12-11 LAB — BASIC METABOLIC PANEL
Anion Gap: 3 — ABNORMAL LOW (ref 7–16)
BUN: 18 mg/dL (ref 7–18)
CALCIUM: 9 mg/dL (ref 8.5–10.1)
Chloride: 109 mmol/L — ABNORMAL HIGH (ref 98–107)
Co2: 30 mmol/L (ref 21–32)
Creatinine: 1 mg/dL (ref 0.60–1.30)
EGFR (African American): >60
EGFR (Non-African Amer.): >60
Glucose: 79 mg/dL (ref 65–99)
Osmolality: 284 (ref 275–301)
Potassium: 4.8 mmol/L (ref 3.5–5.1)
SODIUM: 142 mmol/L (ref 136–145)

## 2014-12-11 LAB — CK-MB: CK-MB: 1.9 ng/mL (ref 0.5–3.6)

## 2014-12-11 LAB — TROPONIN I: TROPONIN-I: 0.2 ng/mL — AB

## 2014-12-12 LAB — BASIC METABOLIC PANEL
ANION GAP: 7 (ref 7–16)
BUN: 14 mg/dL (ref 7–18)
CREATININE: 1.04 mg/dL (ref 0.60–1.30)
Calcium, Total: 9.4 mg/dL (ref 8.5–10.1)
Chloride: 106 mmol/L (ref 98–107)
Co2: 28 mmol/L (ref 21–32)
EGFR (Non-African Amer.): >60
Glucose: 93 mg/dL (ref 65–99)
Osmolality: 281 (ref 275–301)
Potassium: 4.3 mmol/L (ref 3.5–5.1)
Sodium: 141 mmol/L (ref 136–145)

## 2014-12-12 LAB — CBC WITH DIFFERENTIAL/PLATELET
Basophil #: 0 10*3/uL (ref 0.0–0.1)
Basophil %: 0.5 %
EOS PCT: 4.1 %
Eosinophil #: 0.2 10*3/uL (ref 0.0–0.7)
HCT: 37.3 % — ABNORMAL LOW (ref 40.0–52.0)
HGB: 11.7 g/dL — ABNORMAL LOW (ref 13.0–18.0)
Lymphocyte #: 1 10*3/uL (ref 1.0–3.6)
Lymphocyte %: 15.9 %
MCH: 27.5 pg (ref 26.0–34.0)
MCHC: 31.3 g/dL — ABNORMAL LOW (ref 32.0–36.0)
MCV: 88 fL (ref 80–100)
MONOS PCT: 7.3 %
Monocyte #: 0.4 x10 3/mm (ref 0.2–1.0)
NEUTROS ABS: 4.3 10*3/uL (ref 1.4–6.5)
NEUTROS PCT: 72.2 %
Platelet: 158 10*3/uL (ref 150–440)
RBC: 4.25 10*6/uL — AB (ref 4.40–5.90)
RDW: 16 % — ABNORMAL HIGH (ref 11.5–14.5)
WBC: 6 10*3/uL (ref 3.8–10.6)

## 2014-12-13 LAB — HEMOGLOBIN: HGB: 10.6 g/dL — ABNORMAL LOW (ref 13.0–18.0)

## 2014-12-14 LAB — CBC WITH DIFFERENTIAL/PLATELET
Basophil #: 0 10*3/uL (ref 0.0–0.1)
Basophil %: 0.7 %
EOS ABS: 0.7 10*3/uL (ref 0.0–0.7)
EOS PCT: 10.4 %
HCT: 38.6 % — ABNORMAL LOW (ref 40.0–52.0)
HGB: 12.4 g/dL — ABNORMAL LOW (ref 13.0–18.0)
LYMPHS ABS: 1.6 10*3/uL (ref 1.0–3.6)
Lymphocyte %: 24.2 %
MCH: 27.9 pg (ref 26.0–34.0)
MCHC: 32.1 g/dL (ref 32.0–36.0)
MCV: 87 fL (ref 80–100)
MONO ABS: 0.7 x10 3/mm (ref 0.2–1.0)
Monocyte %: 10.7 %
NEUTROS PCT: 54 %
Neutrophil #: 3.6 10*3/uL (ref 1.4–6.5)
PLATELETS: 159 10*3/uL (ref 150–440)
RBC: 4.43 10*6/uL (ref 4.40–5.90)
RDW: 16.2 % — ABNORMAL HIGH (ref 11.5–14.5)
WBC: 6.8 10*3/uL (ref 3.8–10.6)

## 2015-01-10 ENCOUNTER — Ambulatory Visit: Payer: Self-pay | Admitting: Internal Medicine

## 2015-04-01 NOTE — Consult Note (Signed)
Brief Consult Note: Diagnosis: esophageal food impaction.   Patient was seen by consultant.   Consult note dictated.   Recommend to proceed with surgery or procedure.   Discussed with Attending MD.   Comments: Please see full GI consult [phone].  Patient presenting with esophageal food impaction.  Will need to proceed with removal via egd with anesthesia assistance.  I anticipated he may need to have admission for observation afterwards due to his multiple medical illnesses.  If so will ask Hospitalist for assistance.  Electronic Signatures for Addendum Section:  Barnetta ChapelSkulskie, Vendela Troung (MD) (Signed Addendum 24-Feb-14 23:14)  I have discussed with Mr Joanne GavelSutton and his son the risks benefits and complications  of EGD with foriegn body removal to include not limited to bleeding infection perforation and sedation and he wishes to proceed.   Electronic Signatures: Barnetta ChapelSkulskie, Mahati Vajda (MD)  (Signed 24-Feb-14 22:54)  Authored: Brief Consult Note   Last Updated: 24-Feb-14 23:14 by Barnetta ChapelSkulskie, Heydy Montilla (MD)

## 2015-04-01 NOTE — Discharge Summary (Signed)
PATIENT NAME:  Troy Gonzales, Troy Gonzales MR#:  [id number] DATE OF BIRTH:  14-Aug-1923  DATE OF ADMISSION:  09/28/2013 DATE OF DISCHARGE:  09/30/2013  DISCHARGE DIAGNOSES:   1.  Fall with bradycardia and atrial fibrillation.  2.  Elevated troponin.  3.  Hypertension.  4.  Contusion to right wrist following recent fall.  5.  Vascular dementia.   CHIEF COMPLAINT: Feeling woozy.   HISTORY OF PRESENT ILLNESS: Troy Gonzales is an 81 year old male who presented to the Emergency Room complaining of feeling dizzy and woozy and had been unsteady. The patient reportedly fell and hurt his right wrist and also sustained abrasion to his right elbow. Family subsequently brought him to the Emergency Room, was noted to have an elevated troponin of 0.3. The patient was also noted to be bradycardic and it was felt that his symptoms may have been related to the bradycardia.   PAST MEDICAL HISTORY: Significant for COPD with occupational lung disease, degenerative disk disease, atrial fibrillation, hypertension, vascular dementia. He has had previous cataract surgery, esophageal stricture.   PHYSICAL EXAMINATION: VITAL SIGNS: Temperature was 97.7. Pulse was 68, respirations 20, blood pressure 139/64, pulse oximetry 97% on room air. His heart rate subsequently was noted to be in the 40s. EYES: Conjunctivae were normal.  ENT: No erythema of the throat. No exudate seen.  NECK: No JVD.  LUNGS: Clear to auscultation.  HEART: S1, S2.  ABDOMEN: Soft, nontender.  EXTREMITIES: Evidence of an abrasion over the right elbow.  NEUROLOGIC: Nonfocal.   LABORATORY, DIAGNOSTIC AND RADIOLOGICAL DATA: X-ray of the right forearm was negative. CT of the head showed evidence of atrophy with chronic microvascular change. Troponin 0.3, glucose 89, BUN 14, creatinine 1.13, sodium 138, potassium 4.7, chloride 106, CO2 of 29 LFTs showed bilirubin elevation of 2.2. Albumin was low at 3.1. WBC count 5.1, hemoglobin 11.8 and platelets 158. EKG showed  A. fib with heart rate of 43 beats per minute and right bundle branch block and left anterior hemiblock.   HOSPITAL COURSE: The patient was admitted to [facility], was seen in consultation by cardiologist, Dr. Lady GaryFath, who agreed that his metoprolol and digoxin could be held and he could be carefully hydrated, and aspirin was recommended for antiplatelet therapy. It was felt that his elevated troponin was secondary to demand ischemia. The patient was seen by physical therapy, and his heart rate remained stable in the mid 50s to 60s and his symptoms also significantly improved. He did have some headache and his nitro patch was discontinued, and it was felt that he would benefit from home health and was discharged in stable condition on the following medications.   DISCHARGE MEDICATIONS: Lisinopril 10 mg once a day, aspirin 81 mg a day, Advair Diskus 250/50 one puff b.i.d., vitamin D3 one capsule once a week, pantoprazole 40 mg b.i.d., vitamin B12 at 500 mcg b.i.d. and Systane drops to each eye as needed.   The patient was advised a low-sodium diet and to follow up with me, Dr. Marcello FennelHande, in 1 to 2 weeks' time.   TOTAL TIME SPENT IN DISCHARGING THIS PATIENT: 35 minutes.   ____________________________ Barbette ReichmannVishwanath Migdalia Olejniczak, MD vh:jm D: 10/03/2013 13:20:37 ET T: 10/03/2013 14:43:48 ET JOB#: [id number]  cc: Barbette ReichmannVishwanath Denilson Salminen, MD, <Dictator> Barbette ReichmannVISHWANATH Harshal Sirmon MD ELECTRONICALLY SIGNED 10/16/2013 13:03

## 2015-04-01 NOTE — Op Note (Signed)
PATIENT NAME:  Troy Gonzales, Troy Gonzales MR#:  [id number] DATE OF BIRTH:  04-01-1923  DATE OF PROCEDURE:  03/31/2013  PREOPERATIVE DIAGNOSES: 1.  Vertigo.  2.  Subclavian steal syndrome.  3.  Subclavian stenosis.   POSTOPERATIVE DIAGNOSES: 1.  Vertigo.  2.  Subclavian steal syndrome.  3.  Subclavian stenosis.   PROCEDURES PERFORMED:  1.  Arch aortogram.  2.  Selective injection of the left upper extremity and subclavian artery.   SURGEON: Renford Dills, MD   SEDATION: Versed 3 mg plus fentanyl 100 mcg administered IV. Continuous ECG, pulse oximetry and cardiopulmonary monitoring was performed throughout the entire procedure by the interventional radiology nurse. Total sedation time was 45 minutes.   ACCESS: A 5-French sheath right common femoral artery.   FLUOROSCOPY TIME: 10.2 minutes.   CONTRAST USED: Isovue 35 mL.   INDICATIONS: The patient is an 81 year old gentleman who has been having increasing falls and complaints of dizziness and syncope. Physical examination as well as workup with noninvasive duplex ultrasound suggested a high-grade stenosis of the left subclavian and the possibility for subclavian steal syndrome. The risks and benefits for angiography and the possibility of intervention was all reviewed with the patient and family. All questions were answered. The patient the family have agreed to proceed.   DESCRIPTION OF PROCEDURE: The patient is taken to special procedures and placed in the supine position. After adequate sedation is achieved, the groins are prepped and draped in a sterile fashion. Ultrasound is placed in a sterile sleeve. Ultrasound is utilized to look for appropriate landmarks to avoid vascular injury. Under direct ultrasound visualization, access is obtained to the anterior wall of the common femoral artery. Common femoral artery is echolucent and pulsatile, indicating patency. Image is recorded for the permanent record. Microwire wire followed by micro sheath  is then advanced, J-wire followed by a 5-French sheath and 5-French pigtail catheter. Pigtail catheter is advanced under fluoroscopy and positioned in the ascending aorta. LAO projection of the arch is obtained. After review of the images, combination of a KMP as well as a JB-1 catheter, stiff-angled Glidewire and floppy Glidewire are used. Ultimately JV1 and floppy glide are successful and negotiated into the distal subclavian. Axillary and brachial arteries are imaged and then the wire is reintroduced and a 90 cm, 6-French shuttle sheath is advanced up to the origin where magnified images multiple different obliquities are obtained of the proximal subclavian.   After review of these images, there does appear to be about a 50% stenosis. It does not appear to be hemodynamically significant. It does extend up to the origin of the vertebral. The actual origin in the subclavian is widely patent. On review of the aortic arch image, there is no retrograde filling of the vertebral artery on the left.   Based on these observations, I did not feel that angioplasty and stent placement would have a significant improvement and did not proceed with intervention at this time. Sheath is then pulled back into the external iliac, oblique view is obtained and a Pro-glide device deployed without difficulty. There are no immediate complications.   INTERPRETATION: The arch is opacified with a bolus injection of contrast. There is a 30% to 40% ostial stenosis of the innominate as well as of the left common carotid. The subclavian is widely patent at its origin. Approximately 2 cm distally, there is a 50% stenosis and this extends up to the origins of the vertebral as well as the left internal mammary artery. More distally,  the subclavian, axillary and the proximal portions of the brachial artery are widely patent.   SUMMARY: Approximately 50% stenosis of the subclavian as described above, mild to moderate ostial stenosis of the  common carotid on the left as well as the innominate on the right as described above. No further intervention at this time.   ____________________________ Renford DillsGregory G. Zoe Creasman, MD ggs:cs D: 04/01/2013 14:53:00 ET T: 04/01/2013 15:19:13 ET JOB#: [id number]  cc: Renford DillsGregory G. Tyrone Balash, MD, <Dictator> Barbette ReichmannVishwanath Hande, MD Renford DillsGREGORY G Brantley Wiley MD ELECTRONICALLY SIGNED 04/20/2013 13:46

## 2015-04-01 NOTE — Discharge Summary (Signed)
PATIENT NAME:  Troy Gonzales, Troy Gonzales MR#:  [id number] DATE OF BIRTH:  July 21, 1923  DATE OF ADMISSION:  02/05/2013 DATE OF DISCHARGE:  02/05/2013  DIAGNOSES AT TIME OF DISCHARGE: 1.  Dysphagia secondary to impacted food requiring esophagogastroduodenoscopy and disimpaction, esophageal stricture.  2.  History of coronary artery disease.  3.  History of atrial fibrillation.  4.  Hyperlipidemia. 5.  Chronic obstructive pulmonary disease.  6.  Hypertension.  7.  Type 2 diabetes.  8.  Benign prostatic hypertrophy.   CHIEF COMPLAINT:  Dysphagia and difficulty swallowing secondary to food impaction.   HISTORY OF PRESENT ILLNESS:  The patient is an 81 year old male presented to the ER complaining of difficulty swallowing after eating a big bite of food.  The patient was seen by Dr. Marva PandaSkulskie and underwent an EGD which showed food was found in the lower third of the esophagus.  Removal of the food was accomplished by multiple passes of Roth net, rat-toothed forceps and standard biopsy forceps with benign-appearing intrinsic moderate stenosis measuring 1 cm in length and 9 mm in inner diameter was also found.  There was evidence of bleeding _____ initially which was probably related to emesis.   There was diffuse minimal inflammation characterized by erythema in the gastric body.  The patient had evidence of bile gastritis and normal examined duodenum.  [facility] and started on IV Protonix.  He had an episode of vomiting and also appeared to be confused, but his discharge was therefore held for a day.  Clinically patient appeared to be stable the following day and was discharged in stable condition on the following medications.   DISCHARGE MEDICATIONS:  Protonix 40 mg twice daily, aluminum hydroxide 30 mL q. 6 as needed for indigestion, lisinopril 10 mg a day, Digoxin 125 mcg a day, metoprolol 25 mg by mouth daily, Advair Diskus 250/50 1 puff twice daily and aspirin 81 mg a day.   DIET:  The  patient was advised clear liquid diet for 3 days, followed by full liquid for 3 days, and subsequently soft diet.    FOLLOW-UP:  He has been advised to follow up with me, Dr. Marcello FennelHande.  Also, advised to follow up with Dr. Marva PandaSkulskie for a repeat endoscopic evaluation in 3 weeks.    The patient was stable at time of discharge.   Total time for discharge and co ordination of care: 35 minutes    ____________________________ Barbette ReichmannVishwanath Jakyah Bradby, MD vh:ea D: 02/06/2013 13:26:31 ET T: 02/07/2013 03:07:52 ET JOB#: [id number]  cc: Barbette ReichmannVishwanath Treyvon Blahut, MD, <Dictator> Barbette ReichmannVISHWANATH Camy Leder MD ELECTRONICALLY SIGNED 02/25/2013 13:13

## 2015-04-01 NOTE — H&P (Signed)
PATIENT NAME:  Troy Gonzales, VOLANTE MR#:  [id number] DATE OF BIRTH:  [birth year]  DATE OF ADMISSION:  02/03/2013  REFERRING PHYSICIAN:   Barnetta Chapel, MD  PRIMARY CARE PHYSICIAN:  Dr. Marcello Fennel.  CHIEF COMPLAINT: Dysphagia or unable to swallow. This is secondary to food impaction.   HISTORY OF PRESENT ILLNESS: The patient is an 81 year old Caucasian male who came to the Emergency Department complaining that he is unable to swallow after eating what he called a big bite of food. The patient was evaluated by Dr. Marva Panda and he was taken to the endoscopy unit. The patient does not have any other complaints.  I saw the patient in the postoperative period and he does not give any meaningful history.  REVIEW OF SYSTEMS:  A 10-point system review is unobtainable adequately due to patient being confused after the general anesthesia.    PAST MEDICAL HISTORY:   From his records, he has history of atrial fibrillation, history of chronic obstructive pulmonary disease, hyperlipidemia, history of recurrent supraventricular tachycardia, gastroesophageal reflux disease, diabetes mellitus type 2, systemic hypertension, history of coronary artery disease, status post myocardial infarction in 2011.  PAST SURGICAL HISTORY: Bilateral hernia repair and history of esophageal dilatation with the finding of Schatzki ring.  That was in 2004. History of cholecystectomy.   SOCIAL HABITS:   Nonsmoker. No history of alcohol abuse.   SOCIAL HISTORY: He is widowed, lives at home, has 3 children.   FAMILY HISTORY: According to the records there is a family history of diabetes and cancer, but no specifications.   CURRENT MEDICATIONS:  Metoprolol extended-release 25 mg once a day. Advair 250/50 twice a day, lisinopril 10 mg a day, aspirin 81 mg a day, digoxin 0.125 mg once a day, Prilosec 20 mg once a day.   ALLERGIES:  HE IS ALLERGIC TO PENICILLIN.  PHYSICAL EXAMINATION: VITAL SIGNS: Blood pressure 114/68, respiratory 20, pulse  72, temperature 97.6, oxygen saturation 99%.  GENERAL APPEARANCE: Elderly male lying in bed in no acute distress.  HEAD AND NECK: No pallor. No icterus. No cyanosis.  ENT: Ear examination revealed normal hearing, no lesions, no discharge. Nose examination showed no ulcers, no discharge. Oropharyngeal examination was limited as the patient is uncooperative and confused.  Lips appear normal.  EYES: Normal eyelids and conjunctivae. Pupils are about 5 mm, round and reactive to light on the left. On the right, the patient was closing his eye and I did not have adequate time to examine it. The patient again is confused.  NECK: Supple. Trachea at midline. No thyromegaly. No masses.  HEART:  Normal S1, S2. No S3, S4. No murmur. No gallop. No carotid bruits.  LUNGS:  Breathing pattern without use of accessory muscles. No rales. No wheezing.  ABDOMEN: Soft without tenderness. No hepatosplenomegaly. No masses. No hernias.  SKIN: No ulcers. No subcutaneous nodules.  MUSCULOSKELETAL: No joint swelling. No clubbing.  NEUROLOGIC: Cranial nerves II through XII are intact. No focal motor deficits.  PSYCHIATRIC: The patient is now alert after recovering from general anesthesia, but he is confused, disoriented, keeps asking where I am.  He does not know what happened or why he is here and he keeps asking the same questions. Mood and affect were normal.   LABORATORY FINDINGS: Serum glucose 96, BUN 21, creatinine 1.1, sodium 143, potassium 4, calcium 9.2. CBC showed white count of 6000, hemoglobin 12, hematocrit 39, platelet count 163,  prothrombin time 13. INR 1.   ASSESSMENT: 1.  Dysphagia secondary to impacted food,  underwent upper endoscopy.  It was a prolonged procedure and took more than 2 hours and required sedation again under general anesthesia.  2.  Esophageal stricture with history of Schatzki ring, underwent dilatation in 2003.  3.  Coronary artery disease, status post myocardial infarction in 2011. 4.   Atrial fibrillation. 5.  Hyperlipidemia. 6.  Chronic obstructive pulmonary disease. 7.  Systemic hypertension. 8.  Diabetes mellitus type 2.    9.  Benign prostatic hypertrophy.   PLAN: The patient will be admitted for observation and he is now recovering from the general anesthesia.  I spoke with Dr. Marva PandaSkulskie.  He wants him to be on IV Protonix. I will give 40 mg twice a day. I will keep him on clear liquid until he is able to swallow adequately. Continue the rest of his home medications.   Time spent evaluating this patient: More than 30 minutes.     ____________________________ Carney CornersAmir M. Rudene Rearwish, MD amd:ct D: 02/03/2013 04:46:45 ET T: 02/03/2013 09:05:17 ET JOB#: [id number]  cc: Carney CornersAmir M. Rudene Rearwish, MD, <Dictator> Karolee OhsAMIR Dala DockM Jaidin Ugarte MD ELECTRONICALLY SIGNED 02/04/2013 6:30

## 2015-04-01 NOTE — Consult Note (Signed)
Chief Complaint:  Subjective/Chief Complaint patient doing well this am.  denies n/v or abdominal pain.  Feels "rough"-not specific.   VITAL SIGNS/ANCILLARY NOTES: **Vital Signs.:   25-Feb-14 10:02  Vital Signs Type Routine  Temperature Temperature (F) 97.9  Celsius 36.6  Pulse Pulse 76  Respirations Respirations 18  Systolic BP Systolic BP 829  Diastolic BP (mmHg) Diastolic BP (mmHg) 72  Mean BP 100  Pulse Ox % Pulse Ox % 97  Pulse Ox Activity Level  At rest  Oxygen Delivery Room Air/ 21 %   Brief Assessment:  Cardiac Irregular   Respiratory clear BS   Gastrointestinal details normal Soft  Nontender  Nondistended  No masses palpable  Bowel sounds normal   Lab Results: Routine Chem:  24-Feb-14 21:01   Glucose, Serum 96  BUN  21  Creatinine (comp) 1.17  Sodium, Serum 143  Potassium, Serum 4.3  Chloride, Serum  110  CO2, Serum 30  Calcium (Total), Serum 9.2  Anion Gap  3  Osmolality (calc) 288  eGFR (African American) >60  eGFR (Non-African American)  56 (eGFR values <69m/min/1.73 m2 may be an indication of chronic kidney disease (CKD). Calculated eGFR is useful in patients with stable renal function. The eGFR calculation will not be reliable in acutely ill patients when serum creatinine is changing rapidly. It is not useful in  patients on dialysis. The eGFR calculation may not be applicable to patients at the low and high extremes of body sizes, pregnant women, and vegetarians.)  Routine Coag:  24-Feb-14 21:01   Prothrombin 13.2  INR 1.0 (INR reference interval applies to patients on anticoagulant therapy. A single INR therapeutic range for coumarins is not optimal for all indications; however, the suggested range for most indications is 2.0 - 3.0. Exceptions to the INR Reference Range may include: Prosthetic heart valves, acute myocardial infarction, prevention of myocardial infarction, and combinations of aspirin and anticoagulant. The need for a higher  or lower target INR must be assessed individually. Reference: The Pharmacology and Management of the Vitamin K  antagonists: the seventh ACCP Conference on Antithrombotic and Thrombolytic Therapy. CHBZJI.9678Sept:126 (3suppl): [id number] A HCT value >55% may artifactually increase the PT.  In one study,  the increase was an average of 25%. Reference:  "Effect on Routine and Special Coagulation Testing Values of Citrate Anticoagulant Adjustment in Patients with High HCT Values." American Journal of Clinical Pathology 2006;126:400-405.)  Routine Hem:  24-Feb-14 21:01   WBC (CBC) 6.3  RBC (CBC) 4.66  Hemoglobin (CBC)  12.6  Hematocrit (CBC)  39.6  Platelet Count (CBC) 163 (Result(s) reported on 02 Feb 2013 at 09:12PM.)  MCV 85  MCH 27.1  MCHC  31.9  RDW  15.2   Radiology Results: XRay:    24-Feb-14 21:22, Chest Portable Single View  Chest Portable Single View   REASON FOR EXAM:    FB STUCK IN THROAT  COMMENTS:       PROCEDURE: DXR - DXR PORTABLE CHEST SINGLE VIEW  - Feb 02 2013  9:22PM     RESULT: Comparison made to prior study of 11/11/2010. Cardiomegaly.   Bilateral upper lobe pleural-parenchymal thickening is again noted   consistent with scarring. There's been no significant change. No new   infiltrates.    IMPRESSION:  Stable chest from 11/11/2010. Stable cardiomegaly and changes   of upper lobe scarring bilaterally noted. No foreign bodies identified.        Verified By: TOsa Craver M.D., MD   Assessment/Plan:  Assessment/Plan:  Assessment 1) s/p esophageal meat impaction/removal. stable, no fever.  2) multiple medical problems-stable.   Plan 1) continue bid po protonix 40 mg. 2) clears for 2 days, then full liquids for 3 days then soft/edentulous diet.  3) repeat egd in 3 weeks, gi followup between now and then to arrange.   discussed with Dr Ginette Pitman.   Electronic Signatures: Loistine Simas (MD)  (Signed 25-Feb-14 13:53)  Authored: Chief Complaint,  VITAL SIGNS/ANCILLARY NOTES, Brief Assessment, Lab Results, Radiology Results, Assessment/Plan   Last Updated: 25-Feb-14 13:53 by Loistine Simas (MD)

## 2015-04-01 NOTE — H&P (Signed)
PATIENT NAME:  Troy Gonzales, Troy Gonzales MR#:  [id number] DATE OF BIRTH:  08-Apr-1923  DATE OF ADMISSION:  09/27/2013  PRIMARY CARE PHYSICIAN:  Barbette ReichmannVishwanath Hande, MD  CHIEF COMPLAINT: Feeling woozy.   HISTORY OF PRESENT ILLNESS: This is an 81 year old man who stated that he had a bad night last night, he could not breathe. He had a fall yesterday and hurt his wrist and it was swollen. He feels woozy and dizzy. He did have chest pain on and off for a few seconds, not that severe in intensity, goes away very easily. He has had some dizzy spells. The patient is a poor historian secondary to vascular dementia. Sons at the bedside trying to help out as much they can. In the ER, his troponin was borderline and hospitalist services were contacted for further evaluation.   PAST MEDICAL HISTORY: Vascular dementia, cotton lung, degenerative disk disease, leaky valve, atrial fibrillation, hypertension.   PAST SURGICAL HISTORY: Cataracts, esophageal stricture that was stretched, partial lung removal, hernia repair x 2.   ALLERGIES: No known drug allergies.   MEDICATIONS: Pharmacy tech still working on the medication dosages at this point.   SOCIAL HISTORY: Lives alone. No alcohol. No drug use. 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   FAMILY HISTORY: Father with diabetes and peripheral vascular disease and amputations, died with diabetic complications. Mother died with leukemia.   REVIEW OF SYSTEMS: CONSTITUTIONAL: No fever, chills or sweats. Positive for weight loss. Positive for fatigue.  EYES: He does have trouble with his vision. He has a scratched cornea on right eye and he is using an antibiotic cream for that.  ENT:  Positive for postnasal drip, positive for dysphagia to solids and liquids.  CARDIOVASCULAR: Positive for chest pain with walking.  RESPIRATORY: Positive for shortness of breath. No cough. No sputum. No hemoptysis.  GASTROINTESTINAL: No nausea. No vomiting. No abdominal pain. No diarrhea.  No constipation. No bright red blood per rectum. No melena.  GENITOURINARY: No burning on urination. No hematuria.  MUSCULOSKELETAL: Positive for wrist pain and joint pains.  INTEGUMENT: No rashes or eruptions.  NEUROLOGIC: No fainting or blackouts.  PSYCHIATRIC: No anxiety or depression.  ENDOCRINE: No thyroid problems.  HEMATOLOGIC AND LYMPHATIC: No anemia.   PHYSICAL EXAMINATION: VITAL SIGNS: On presentation to the Emergency Room included a temperature of 97.7, pulse was listed as 68, respirations 20, blood pressure 139/64, pulse ox 97% on room air. When I was in the room the heart rate was in the 40s the entire time.  GENERAL: No respiratory distress.  EYES: Conjunctivae and lids normal. Pupils equal, round and reactive to light. Extraocular muscles intact. No nystagmus.  EARS, NOSE, MOUTH AND THROAT: Tympanic membranes: No erythema. Nasal mucosa: No erythema. Throat: No erythema. No exudate seen. Lips and gums: No lesions.  NECK: No JVD. No bruits. No lymphadenopathy. No thyromegaly. No thyroid nodules palpated.  LUNGS: Clear to auscultation. No use of accessory muscles to breathe. No rhonchi, rales or wheeze heard.  CARDIOVASCULAR: S1, S2 bradycardia. No gallops or rubs heard. No murmurs heard. Carotid upstroke 2+ bilaterally, bruit on the left side.  EXTREMITIES: Dorsalis pedis pulses 2+ bilaterally. No edema of the lower extremities.  ABDOMEN: Soft, nontender. No organomegaly/splenomegaly. Normoactive bowel sounds. No masses felt.  LYMPHATIC: No lymph nodes in the neck.  MUSCULOSKELETAL: No clubbing, edema or cyanosis.  SKIN: No ulcers or lesions seen, some bruising seen on the arm.  NEUROLOGIC: Cranial nerves II through XII grossly intact. Deep tendon  reflexes are 2+ bilateral lower extremity. The patient is able to straight leg raise bilaterally.  PSYCHIATRIC: The patient is alert and oriented to person and place.   LABORATORY AND RADIOLOGICAL DATA: Right forearm negative. CT scan  of the head atrophy with chronic microvascular ischemic change, old right parietal infarct. Urinalysis negative. Troponin borderline at 0.3. Glucose 89, BUN 14, creatinine 1.13, sodium 138, potassium 4.7, chloride 106, CO2 29, calcium 8.9. Liver function tests: Total bilirubin elevated at 2.2, other liver function tests normal. Albumin low at 3.1. White blood cell count 5.1,  H and H 11.8 and 35.8, platelet count of 158. EKG shows A. fib, 43 beats per minute, right bundle branch block, left anterior fascicular block.   ASSESSMENT AND PLAN: 1.  Bradycardia with atrial fibrillation, falls and feeling woozy. I suspect that the bradycardia will have something to do with his symptoms. Will hold the digoxin at this point in time and check a level. Hold the metoprolol at this time and watch heart rate on telemetry. Will give gentle IV fluids. Continue aspirin only for anticoagulation since he is a fall risk. He is off his Coumadin for a period of time now.  2.  Elevated troponin, not quite sure what to make of this. Will get serial cardiac enzymes, continue his aspirin. Unable to give metoprolol with the bradycardia. Will monitor on telemetry for arrhythmias.  3.  Hypertension. Blood pressure on the lower side. Will hold all of his blood pressure medications at this point.  4.  Wrist pain. Will get a dedicated x-ray of the wrist. The patient was put in a brace by the ER physician.  5.  Vascular dementia.  6.  Cotton mill lung. Will continue the patient's Advair.  7.  We will get a physical therapy evaluation to check his gait.   TIME SPENT ON ADMISSION: 55 minutes.   CODE STATUS: The patient is a full code.    ____________________________ Herschell Dimes. Renae Gloss, MD rjw:cs D: 09/27/2013 16:56:40 ET T: 09/27/2013 18:33:17 ET JOB#: [id number]  cc: Herschell Dimes. Renae Gloss, MD, <Dictator> Barbette Reichmann, MD Salley Scarlet MD ELECTRONICALLY SIGNED 09/28/2013 10:20

## 2015-04-01 NOTE — Consult Note (Signed)
   Present Illness Called by floor with regard to an urgent cardiology consult on an 81 yo male with history of chronic afib treated in the past with warfarin and digoxin and metoprolol, mild CKD who was admitted after suffering what appears to have been a mechanical fall in his yard. He was walking on an uneven portion of the yard when he lost his balance and fell on his right wrist. He denies being dizzy or lightheaded and denies loss of consciousness. His sons noted that his wrist was more swollen than with previous falls and brouight him to the er to get xrays. While in the er, he was noted to be bradycardic. Also had a mild tropoonin elevation to 0.31. He denied chest pain prior to or while in the er. EKG revealed afib with slow vr with no injury or ischemia.He had mild acute on chronic CKD. He has been treated with digoxin and metoprolol for his rate. His sons noted that he may have taken both the morning and evening meds on day of admission. Rate has improved since drugs were held.   Physical Exam:  GEN well nourished, no acute distress   HEENT PERRL, hearing intact to voice   RESP clear BS  no use of accessory muscles   CARD Irregular rate and rhythm  Bradycardic  Murmur   Murmur Systolic   Systolic Murmur axilla   ABD denies tenderness  normal BS  no Adominal Mass   LYMPH negative neck, negative axillae   EXTR negative cyanosis/clubbing, negative edema   SKIN skin tear on the right elbow   NEURO cranial nerves intact, motor/sensory function intact   PSYCH alert, poor insight   Review of Systems:  Subjective/Chief Complaint right wrist pain   General: No Complaints   Skin: skin tear on right wrist   ENT: No Complaints   Eyes: No Complaints   Neck: No Complaints   Respiratory: No Complaints   Cardiovascular: Palpitations   Gastrointestinal: No Complaints   Genitourinary: No Complaints   Vascular: No Complaints   Musculoskeletal: right wrist pain    Neurologic: No Complaints   Hematologic: No Complaints   Endocrine: No Complaints   Psychiatric: No Complaints   Review of Systems: All other systems were reviewed and found to be negative   Medications/Allergies Reviewed Medications/Allergies reviewed   EKG:  Interpretation afib with slow vr with rbbb, lafb    Penicillin: Unknown   Impression 81 yo male with history of chronic afib trated with rate control with metoprolol and digoxin as an outpatient with asa antiplatelet therapy. He suffered what appears to be a mechanical fall injuring his right wrist. He denied syncope or dizziness and lost his balance. He has no complaints of chest pain. He has a mild troponin elevation of 0.31 which appears to be demand ischemia. Clinical history and symptoms and ekg do not suggest an acute ischemic event. Would agree with holding digoxin and metoprolol. Conitnue with asa. Not an ideal candidate for cath given symptoms. Bradycardia is likely secondary to his drugs. Does not need a pacemaker at present   Plan 1.Continue to hold metoprolol and digoxin 2. Follow heart rate. Currently 55 3. ASA for antiplatelet therapy 4. Careful hydration 5. Ambulate with assistance and follow for symptoms.   Electronic Signatures: Dalia HeadingFath, Kenneth A (MD)  (Signed 20-Oct-14 13:18)  Authored: General Aspect/Present Illness, History and Physical Exam, Review of System, EKG , Allergies, Impression/Plan   Last Updated: 20-Oct-14 13:18 by Dalia HeadingFath, Kenneth A (MD)

## 2015-04-02 NOTE — Consult Note (Signed)
Brief Consult Note: Diagnosis: fall/syncope.   Patient was seen by consultant.   Consult note dictated.   Comments: Appreciate consult for 81 y/o caucasian man with hx of atrial fibrillation, bradycardia, CVA, subclavian steal syndrome, vascular dementia, DDD, for evaluation of dysphagia. In discussion with patient and his son, this has been a  recurrent long term issue,  had EGD about a year ago for foreign body obstruction: food was removed and he was to have follow up EGD for the finding of a moderate benign appearing stenosis. This was arranged in clinic [facility] however patient experience some significant dizzinees after that visit, so procedure was delayed. In conversation with patient and family, they are concerned about sedated procedures that may cause further issue. Healthwise, they report that he has had a few falls and significant cognitive decline over the last year, on and off chest pain/dizziness, and some sob. He has a speech consult ordered for evaluation/treatment. Gi wise, patient reports some intermittent heartburn and  nausea. States sometimes food bothers his stomach after eating. They report that he gets choked usually mid swallow, and it can occur with solids or liquids. Happened 3-4 times over the last month. Sometimes relieves on its own, sometimes regurgitates and it relieves. Denies NSAIDS. Not taking PPI currently. Denies furhter GI complaints.  IMpression and plan: intermittent dysphagia. History of esophageal strictures. Continue with speech eval at present. If not started already, start Pantoprazole 40mg  po daily. Further recommendations to follow.  Addendum: did review speech therapsist's note- EGD may be of benefit, however patient is at high risk for sedated procedures due to co-morbidities and age. Do also recommend pulmonary follow up for his new lung nodule.  Electronic Signatures: Vevelyn PatLondon, Christiane H (NP)  (Signed 26-May-15 15:23)  Authored: Brief Consult  Note   Last Updated: 26-May-15 15:23 by Keturah BarreLondon, Christiane H (NP)

## 2015-04-02 NOTE — Discharge Summary (Signed)
PATIENT NAME:  Troy Gonzales, Troy Gonzales MR#:  [id number] DATE OF BIRTH:  Aug 24, 1923  DATE OF ADMISSION:  04/30/2014 DATE OF DISCHARGE:   05/06/2014  DIAGNOSES AT TIME OF DISCHARGE: 1.  Fall with left-sided chest wall pain.  2.  Chronic dizziness.  3.  Chronic atrial fibrillation.  4.  Hypertension.  5.  Vascular dementia.  6.  Pneumonia.  7.  Left lung nodule.  8.  Dysphagia with intermittent vomiting.   CHIEF COMPLAINT:  Fall and left lateral chest pain.   HISTORY OF PRESENT ILLNESS:  Troy Gonzales is an 81 year old male with a history of vascular dementia, anxiety, history of degenerative disk disease in Afib, who presented to the ED after he fell at home. The patient was trying to do some vacuuming and apparently his feet got tangled up in the vacuum cord and the patient had a fall. He did complain of some left-sided chest wall pain that was worse on coughing and also palpation of the left chest.   PAST MEDICAL HISTORY: Significant for vascular dementia, cotton lung, degenerative joint disease, valvular heart disease, atrial fibrillation and hypertension.   PAST SURGICAL HISTORY: Significant for esophageal stricture with multiple dilatations, hernia repair x 2. Please see history and physical for other details.   HOSPITAL COURSE:  The patient was admitted to [facility] and underwent a CT of the chest, which showed evidence for a 7.6 mm ill-defined nodule in the left lower lobe that was new compared to prior study. It was not clear whether this was inflammatory or neoplastic, and a followup CT was recommended  in 3 to 6 months. There was also a small area of consolidation at the diaphragmatic base of the left lower lobe, which could represent a small area of pneumonia. There is extensive lung changes of scarring, interstitial fibrosis and centrilobular and paraseptal emphysema with cardiomegaly. During his stay in the hospital, the patient was seen by physical therapy. He also had episodes of bradycardia and  was intermittently dizzy. He did have some choking episodes and difficulty swallowing and his diet was initially changed to liquid and subsequently gradually advanced to full liquid and pureed diet. He was also seen by gastroenterologist, Dr. Marva Panda, who had discussion with the family, but the family was not keen for him to have another EGD at this point. The patient was seen by physical therapy and was able to ambulate to some extent. It was felt that he would benefit from home health nursing and PT. The patient was advised a pureed diet with honey thick liquids and advised to continue his course of Levaquin for 5 more days. He was also started on escitalopram for depression.   MEDICATIONS ON DISCHARGE:  Levaquin 250 mg p.o. daily for 5 days, escitalopram 5 mg once a day, albuterol inhaler 2 puffs q.i.d. p.r.n., pantoprazole 40 mg b.i.d., aspirin 81 mg a day, artificial tears 1 drop to each eye b.i.d., vitamin D3, 10,000 units, 1 capsule once a week; lisinopril 20 mg once a day and vitamin B12, 500 mcg b.i.d.   The patient is advised to follow up with me, Dr. Marcello Fennel, in 1 to 2 weeks' time, and also follow up with Dr. Marva Panda in 1 to 2 weeks.  The family has been instructed to call back with any questions or concerns. Advised that he will need to have a repeat CT scan in 3 to 4 months to check on the left lung nodule. The patient is stable at the time of discharge.  TOTAL TIME SPENT ON DISCHARGING PATIENT: 35 minutes   ____________________________ Barbette ReichmannVishwanath Tanaya Dunigan, MD vh:dmm D: 05/06/2014 12:21:36 ET T: 05/06/2014 12:32:04 ET JOB#: [id number]  cc: Barbette ReichmannVishwanath Fain Francis, MD, <Dictator> Barbette ReichmannVISHWANATH Anusha Claus MD ELECTRONICALLY SIGNED 05/18/2014 13:33

## 2015-04-02 NOTE — Consult Note (Signed)
   Present Illness 81 yo male with history of chronic afib admitted after a mechanical fall. He was noted to have afib with variable but largely bradycardic ventricular response. Pt is a very difficult historian due to dementia. Review of telemetry strips reveals evidence of afib with vr between 40-70. No pauses. Pt apparently did not have a syncopal episode. He is currrently hemodynamically stable. He is not on any rate related meds.   Physical Exam:  GEN disheveled   NECK supple   RESP no use of accessory muscles   CARD Irregular rate and rhythm  Bradycardic  Murmur   Murmur Systolic   Systolic Murmur axilla   ABD denies tenderness  normal BS  no Adominal Mass   LYMPH negative neck, negative axillae   EXTR negative cyanosis/clubbing, negative edema   SKIN normal to palpation   NEURO cranial nerves intact, motor/sensory function intact   PSYCH poor insight   Review of Systems:  Subjective/Chief Complaint rib pain   General: No Complaints   ENT: No Complaints   Eyes: No Complaints   Neck: No Complaints   Respiratory: No Complaints   Cardiovascular: No Complaints   Gastrointestinal: No Complaints   Genitourinary: No Complaints   Vascular: No Complaints   Musculoskeletal: rib pain   Review of Systems: All other systems were reviewed and found to be negative  very poor historian   Medications/Allergies Reviewed Medications/Allergies reviewed   EKG:  Interpretation afib with slow vr with rbbb, lafb    Penicillin: Unknown   Impression 81 yo male admitted after a mechancial fall with rib pain. Noted to be in afib with slow vr. No pauses. Pt complains of being dizzy but no recent synope. Off rate related meds. Not candidate for ppm at present. WOuld remain off of rate related meds and follow heart rate and hemodynamics   Plan 1. Continue current meds. 2FOllow heart rate on telemetry 3. Further recs pending course   Electronic Signatures: Dalia HeadingFath, Kenneth A  (MD)  (Signed 26-May-15 07:31)  Authored: General Aspect/Present Illness, History and Physical Exam, Review of System, EKG , Allergies, Impression/Plan   Last Updated: 26-May-15 07:31 by Dalia HeadingFath, Kenneth A (MD)

## 2015-04-02 NOTE — Consult Note (Signed)
Chief Complaint:  Subjective/Chief Complaint Patient seen for dysphagia.  Please see full GI consult and brief consult note.  Patietn presented with dizziness in the setting of AF and dementia.  History of at least 6 egd and dilations for distal esophageal narrowing, and the last egd needed due to a large esophageal obstruction of meat bolus.  This required a prolonged sedation to safely clear the obstruction.  Family expresses concern for possible mental status changes due to the sedation, and his other medical issues.  I will disucess further with tesults of speech path evaluation and feeding options.  Further recs to follow.   VITAL SIGNS/ANCILLARY NOTES: **Vital Signs.:   26-May-15 13:37  Temperature Temperature (F) 98.2  Celsius 36.7  Pulse Pulse 80  Respirations Respirations 20  Systolic BP Systolic BP 138  Diastolic BP (mmHg) Diastolic BP (mmHg) 68  Mean BP 91  Pulse Ox % Pulse Ox % 97  Pulse Ox Activity Level  At rest  Oxygen Delivery Room Air/ 21 %   Electronic Signatures: Barnetta ChapelSkulskie, Martin (MD)  (Signed 26-May-15 18:10)  Authored: Chief Complaint, VITAL SIGNS/ANCILLARY NOTES   Last Updated: 26-May-15 18:10 by Barnetta ChapelSkulskie, Martin (MD)

## 2015-04-02 NOTE — Consult Note (Signed)
PATIENT NAME:  Troy Gonzales, Troy Gonzales MR#:  [id number] DATE OF BIRTH:  [birth year]  CARDIOLOGY CONSULTATION   DATE OF CONSULTATION:  05/18/2014  CONSULTING PHYSICIAN:  Marcina MillardAlexander Marcianne Ozbun, MD  PRIMARY CARE PHYSICIAN: Barbette ReichmannVishwanath Hande, MD  CARDIOLOGIST: Marcina MillardAlexander Ebrima Ranta, MD  CHIEF COMPLAINT: Shortness of breath and chest pain.  HISTORY OF PRESENT ILLNESS: The patient is an 81 year old gentleman with history of COPD, atrial fibrillation and hypertension. The patient was in his usual state of health until day of admission, when he experienced shortness of breath and chest pain while at church. The patient presented to [facility] Emergency Room and was admitted to telemetry. The patient had borderline elevated troponin of 0.20. EKG was nondiagnostic. The patient was recently hospitalized 04/30/2014, at which time he was noted to have an ill-defined mass in his chest. Of note, the patient has undergone previous cardiac catheterization which revealed normal coronary anatomy.   PAST MEDICAL HISTORY:  1. COPD.  2. Atrial fibrillation.  3. Hypertension.  4. History of esophageal stricture.  5. Dysphagia.  6. Degenerative joint disease.   MEDICATIONS:  1. Lisinopril 20 mg daily.  2. Lexapro 5 mg daily.  3. Aspirin 81 mg daily.  4. Artificial Tears b.i.d. 5. ProAir 2 puffs p.r.n. 6. Vitamin B12.  7. Vitamin D3.   SOCIAL HISTORY: The patient currently lives alone. He quit tobacco abuse in 1998.   FAMILY HISTORY: No immediate family history for myocardial infarction.   REVIEW OF SYSTEMS:  CONSTITUTIONAL: No fever or chills.  EYES: No blurry vision.  EARS: No hearing loss.  RESPIRATORY: The patient has chronic exertional dyspnea due to underlying COPD.  CARDIOVASCULAR: The patient has intermittent chest discomfort, as described above.  GASTROINTESTINAL: No nausea, vomiting or diarrhea.  GENITOURINARY: No dysuria or hematuria.  ENDOCRINE: No polyuria or polydipsia.  MUSCULOSKELETAL: No arthralgias  or myalgias.  NEUROLOGICAL: No focal muscle weakness or numbness.  PSYCHOLOGICAL: No depression or anxiety.   PHYSICAL EXAMINATION:  VITAL SIGNS: Blood pressure 132/71, pulse 53, respirations 18, temperature 98.1, pulse oximetry 96%.  HEENT: Pupils equally reactive to light and accommodation.  NECK: Supple, without thyromegaly.  LUNGS: Reveal decreased breath sounds in both bases.  CARDIOVASCULAR: Normal JVP. Normal PMI. Regular rate and rhythm. Normal S1, S2. No appreciable gallop, murmur or rub.  ABDOMEN: Soft, nontender. Pulses were intact bilaterally.  MUSCULOSKELETAL: Normal muscle tone.  NEUROLOGICAL: The patient is alert and oriented x3. Motor and sensory both grossly intact.   IMPRESSION: An 81 year old gentleman who presents with shortness of breath and chest pain, with underlying chronic obstructive pulmonary disease. The patient has known normal coronary anatomy by previous cardiac catheterization. The patient has borderline elevated troponin, without significant peak or trough. EKG is nondiagnostic. This likely represents demand/supply ischemia.   RECOMMENDATIONS:  1. Agree with overall current therapy.  2. Would defer full-dose anticoagulation.  3. Would defer cardiac catheterization in light of the patient's advanced age and known normal coronary anatomy by previous cardiac catheterization.  4. Add Imdur 30 mg daily.  5. Continue to hold lisinopril for now. The patient's son reports that he has been experiencing postural dizziness due to lisinopril. Will carefully monitor blood pressure during his hospitalization.    ____________________________ Marcina MillardAlexander Daisean Brodhead, MD ap:lb D: 05/18/2014 08:27:51 ET T: 05/18/2014 08:42:57 ET JOB#: [id number]  cc: Marcina MillardAlexander Ezmeralda Stefanick, MD, <Dictator> Marcina MillardALEXANDER Rushton Early MD ELECTRONICALLY SIGNED 05/25/2014 8:29

## 2015-04-02 NOTE — Consult Note (Signed)
PATIENT NAME:  Troy Gonzales, Trapper C MR#:  [id number] DATE OF BIRTH:  [birth year]  DATE OF CONSULTATION:  05/04/2014  REFERRING PHYSICIAN:  Barbette ReichmannVishwanath Hande, MD CONSULTING PHYSICIAN:  Keturah Barrehristiane H. London, NP  REASON FOR CONSULTATION: Difficulty swallowing, history of esophageal stricture.   HISTORY OF PRESENT ILLNESS: Appreciate consult for 81 year old Caucasian man with history of atrial fibrillation, bradycardia, CVA, subclavian steal syndrome, vascular dementia, DDD, for evaluation of dysphagia. In discussion with the patient and his son, this has been a recurrent long-term issue. Has had multiple EGDs over the last 12 to 13 years. Last had EGD about a year ago for foreign body obstruction. Food was removed, and he was to have followup EGD for the finding of moderate benign-appearing stenosis. This was arranged in clinic at [facility]; however, the patient experienced some significant dizziness after that visit, so the procedure was delayed. In further conversation with patient and family, the patient had experienced significant cognitive decline and had various other health problems over the last year. The procedure was never rescheduled. Family state that they are concerned about further sedated procedures contributing to further cognitive decline of the patient. Also report on-and-off history of chest pain, dizziness, intermittent shortness of breath. He has also had a speech consult ordered for evaluation and treatment. GI-wise, the patient reports some intermittent heartburn and nausea. States sometimes food bothers his stomach after eating. They report sometimes he gets choked mid swallow. It can occur with solids or liquids. It has happened 3 to 4 times over the last month. Sometimes it relieves on its own. Sometimes he has to regurgitate and it relieves. Denies NSAIDs. Not taking any PPI currently. No further GI complaints. In further chart review, speech evaluation is concerning for recurrent regurgitation with  all different types of food boluses. He has had a barium swallow, last dating to 2009, with severe lower esophageal spasm. He has had 3 or 4 dilations of the esophagus. He was also admitted with a mildly elevated troponin and bradycardia. Dr. Lady GaryFath has been consulted with cardiology.   PAST MEDICAL HISTORY: Vascular dementia, cotton lung, DJD, leaky valve, atrial fibrillation, hypertension, cataract, esophageal strictures, partial lung removal, hernia repair x 2.   ALLERGIES: PENICILLIN.   SOCIAL HISTORY: Lives alone. Family do visit every day. No EtOH or drugs.   FAMILY HISTORY: Significant for diabetes, PVD, leukemia.   MEDICATIONS: Vitamin D3 at 10,000 international units q. week, vitamin B12 at 500 mcg p.o. b.i.d., ProAir 2 puffs q.4 h. p.r.n., lisinopril 20 mg p.o. daily, escitalopram 5 mg p.o. at bedtime,  ASA 81 mg p.o. daily, Artificial Tears to affected eyes b.i.d. p.r.n. They do report he has had several medication changes over the last year due to his health.   REVIEW OF SYSTEMS: Ten systems reviewed. Significant for weakness, left-sided chest pain after a recent fall which precipitated his admission here. Otherwise unremarkable other than what is noted above.   LABORATORY DATA: Most recent: BNP 4013. Glucose 90, BUN 18, creatinine 0.95, sodium 138, potassium 3.9, GFR greater than 60, calcium 9.3, total protein 7.0, albumin 3.4, total bilirubin 2.4, ALP 114, AST 27, ALT 11. CPK-MB enzymes have been negative x 3. Troponin has 0.23 0.24, 0.21. WBC 5.3, hemoglobin 11, hematocrit 35.3, platelet count 152.   PHYSICAL EXAMINATION: VITAL SIGNS: Most recent: Temperature 98.3, pulse 83, respiratory rate 20, blood pressure 156/69, SaO2 of 95% on room air.  GENERAL: Pleasant elderly man lying in bed in no acute distress.  HEENT: Normocephalic, atraumatic. Sclerae are  clear. There is no icterus.  NECK: Supple. No JVD, thyromegaly, lymphadenopathy.  RESPIRATORY: Respirations eupneic. Lungs clear  bilaterally.  CARDIAC: S1, S2. Regular rate; rhythm is irregular. No MRG. No apparent tenderness at this time.  ABDOMEN: Bowel sounds x 4. Soft, nondistended, minimal diffuse tenderness. No guarding, rigidity, peritoneal signs, rebound tenderness, hepatosplenomegaly or masses.  SKIN: Warm, dry, pink. No erythema, lesion or rash.  MUSCULOSKELETAL: Strength 5 out of 5. MAEW x 4. Sensation appears to be intact.  NEUROLOGIC: Alert, oriented x 3 presently. Speech clear. No facial droop. Cranial nerves intact.  PSYCHIATRIC: Pleasant, calm, cooperative.   IMPRESSION AND PLAN: Intermittent chronic dysphagia, likely multifactorial with history of esophageal spasm and strictures. Continue speech recommendations at present. Additionally, would advise nothing more than a pureed diet. Agree with pantoprazole therapy. EGD may be of benefit; however, due to family concerns and the patient's high risk due to his comorbidities and some cardiac issues, we will hold for now. May pursue this based on family desires as clinically feasible. Additionally, did note a new pulmonary nodule on his chest CT. Would recommend pulmonary followup for this.   Thank you very much for this consult.   These services were provided by Vevelyn Pat, MSN, [facility], in collaboration with Christena Deem, MD, with whom I have discussed this patient in full.   ____________________________ Keturah Barre, NP chl:jcm D: 05/04/2014 17:07:05 ET T: 05/04/2014 17:45:39 ET JOB#: [id number]  cc: Keturah Barre, NP, <Dictator> Eustaquio Maize LONDON FNP ELECTRONICALLY SIGNED 05/19/2014 14:21

## 2015-04-02 NOTE — H&P (Signed)
PATIENT NAME:  Troy Gonzales, Troy Gonzales MR#:  [id number] DATE OF BIRTH:  1923/08/20  DATE OF ADMISSION:  05/16/2014  REFERRING PHYSICIAN:  Dr. Mindi Gonzales  PRIMARY CARE PHYSICIAN: Dr. Marcello Gonzales   CHIEF COMPLAINT: Shortness of breath, chest pain, and not feeling very good, dizzy, lightheaded today.   HISTORY OF PRESENT ILLNESS:  This is a very nice, 81 year old gentleman with history of vascular dementia, DJD, atrial fibrillation, hypertension, COPD secondary to cotton lung and  possible tobacco as well. The patient was recently admitted on May 22, discharged on  May 28. During this hospitalization, the patient was discovered to have a mass on the chest. He also had significant dysphagia, for what his diet has been changed. His mass was 7.6 mm, ill-defined nodule, unknown if inflammatory or metastatic. The patient was discharged in good condition home with treatment with antibiotics for possible pneumonia. The patient is coming back today because he has been doing fine at home, and this morning he woke up and checked his blood pressure. Blood pressure was 114/70. He took his medications and his vitamins, his blood pressure medications. His blood pressure dropped to 80/50 within an hour after taking his medication. He felt dizzy, lightheaded, but decided to go to church. In the car, he was doing fine, but walking from the car to the entrance of the church, he started to have some shortness of breath, feeling dizzy, lightheaded, went down to the service, sat down and did not last 15 minutes until he said that he needed to go. On the way home, he decided to go to the hospital. The family is here to give all the history, as the patient is demented and not a very good historian. At this moment, the patient states that he is asymptomatic, does not have any shortness of breath or chest pain. He wants to go home. On talking with the family, they will feel better if he gets in as an observation for his chest pain. The patient had  elevation of troponin, which is chronically elevated, and today is not different than in the past.   REVIEW OF SYSTEMS:  Unable to obtain due to dementia.   PAST MEDICAL HISTORY: 1.  Vascular dementia.  2.  COPD/cotton lung.  3.  DJD.   4.  Atrial fibrillation.  5.  Hypertension.  6.  Esophageal stricture. 7.  Dysphagia.     ALLERGIES: PENICILLIN.   PAST SURGICAL HISTORY:  1.  Cataract surgery.  2.  Esophageal stricture, status post dilation.  3.  Partial lung lobectomy.  4.  Hernia repair x 2.   SOCIAL HISTORY: The patient actually lives by himself, but next door to one of his sons. He is being constantly monitored by his family. At least one son drops by twice daily, and the other one is around there about the same time. The patient does not drink, does not use any drugs. He used to smoke, quit in 1998. He used to work in a [employer].   FAMILY HISTORY: Positive for diabetes with complications in his father, with peripheral vascular disease, and his mother had leukemia.   CURRENT MEDICATIONS: Include lisinopril 20 mg daily, Lexapro 5 mg daily, aspirin 81 mg daily, Artificial Tears twice daily, ProAir HFA 2 puffs as needed for shortness of breath, vitamin B12 and vitamin D3. He was discharged also on Levaquin, but he still has a couple of days left. Protonix 40 mg twice daily. Unable to obtain any other information from the patient.  PHYSICAL EXAMINATION: VITAL SIGNS: Blood pressure of 97/56 on arrival, heart rate 89, respirations 18, temperature 97.1, oxygen saturation 99% to 100% on room air.  GENERAL: Alert, oriented x 1. A very pleasant gentleman, demented, no agitation. The patient is hemodynamically stable at this moment.  HEENT: Pupils are equal and reactive. Extraocular movements are intact. Mucosa is slightly dry. Anicteric sclerae. Pink conjunctivae. No oral lesions. No oropharyngeal exudates.  NECK: Supple. No JVD. No thyromegaly. No adenopathy. No carotid bruits.   CARDIOVASCULAR: Regular rate and rhythm. Positive murmur, systolic, ejection murmur  pansystolic, with diastolic murmur at the level of the first fire of diastole. Sounds decrescendo, with radiation into the neck. No tenderness to palpation anterior chest wall.  LUNGS:  Overall clear, without any wheezing or crepitus. No use of accessory muscles.  ABDOMEN: Soft, nontender, nondistended. No hepatosplenomegaly. No masses.  GENITAL: Exam is deferred.  EXTREMITIES: No edema, cyanosis, clubbing.  VASCULAR: Pulses +2. Capillary refill less than 3.  NEUROLOGIC: Cranial nerves II through XII intact. Strength is 5/5 in all 4 extremities. DTRs are +2.  PSYCHIATRIC: No agitation or depression at this moment. The patient is cooperative and pleasant. He is alert, but oriented only to person.  LYMPHATIC: Negative for lymphadenopathy in the neck and supraclavicular area.  MUSCULOSKELETAL: No joint effusions or joint swelling.  SKIN: No rashes, petechiae or lesions.  RESULTS: BNP is 1600. Glucose 121, BUN 25, creatinine 1.28. GFR is around 50%. Troponin 0.02, which has been elevated, under 0.2 before. Hemoglobin is 12, white count is 5.8, platelet count 216. Urinalysis pending.   EKG: No ST depression or elevation seen. The patient has atrial fibrillation on the EKG, which is chronic. No changes when compared with prior. Possible anteroseptal MI in the past. Right bundle branch block. Actually, his T waves look a little bit more hyperacute than they were before.   ASSESSMENT AND PLAN: This is a very nice, 81 year old gentleman with history of recent hospitalization discharge due to fall. Has new discovery of mass in the chest. It could be inflammatory versus malignant. Has vascular dementia, COPD, with cotton lung disease, DJD, atrial fibrillation, hypertension, and esophageal stricture that is causing significant dysphagia, for what he takes a pureed diet.   1.  Chest pain. The patient had this chest pain on  exertion. He cannot give details of it. Actually whenever the patient had an episode of dizziness, lightheadedness, and shortness of breath, he was not complaining of chest pain. He said that he was having chest pain here in the ER. His troponin is 0.2, which has been at that same level from before, since the chronic elevation of what we call troponin leak. The patient is going to be just observed. Cardiac enzymes are going to be trended and monitored. The patient monitored under telemetry. His EKG has some slight changes. They are not necessarily significant, but we are going to monitor. The patient is a patient of Dr. Marcello Fennel. I am going to let Dr. Marcello Fennel decide if he wants an echocardiogram on him. He had an ultrasound of the carotid arteries before, showing some possible 50% to 60% stenosis, but this was equivocal.   2. Low blood pressure. At this moment, the patient has a blood pressure of 121/52, earlier was 80s over 50s. The patient likely has very stiff arteries with isolated systolic hypertension. We are going to check orthostatics on him. Recommend to stop blood pressure medications. Monitor closely, and only treat if his blood pressure gets significantly elevated  in the 160s-180s in the outpatient setting.   3.  The patient received IV fluids today. His BUN is slightly elevated. His creatinine is slightly higher than before, but not to the point of acute kidney failure. This could be just signs of dehydration and intravascular volume depletion.   4.  Replace volume with IV fluids, isotonic saline.  5.  Atrial fibrillation, seems to be rate controlled. No blood pressure medications given at this moment. No need for rate control with medications.   6.  Other medical problems are stable.   7.  The patient is a FULL CODE. He is demented, but pleasant and highly functional.   I spent about 45 minutes with the patient and the family.    ____________________________ Felipa Furnace,  MD rsg:mr D: 05/16/2014 15:22:27 ET T: 05/16/2014 16:08:42 ET JOB#: [id number]  cc: Felipa Furnace, MD, <Dictator> Jerid Catherman Juanda Chance MD ELECTRONICALLY SIGNED 05/16/2014 19:35

## 2015-04-02 NOTE — Consult Note (Signed)
Chief Complaint:  Subjective/Chief Complaint seen for dysphagia.  states he is doing well tolerated full liquids for lunch.  denies abdominal pain, still tneder left ribs.   VITAL SIGNS/ANCILLARY NOTES: **Vital Signs.:   27-May-15 14:08  Vital Signs Type Q 4hr  Celsius 36.3  Temperature Source oral  Pulse Pulse 60  Respirations Respirations 18  Systolic BP Systolic BP 537  Diastolic BP (mmHg) Diastolic BP (mmHg) 61  Mean BP 91  Pulse Ox % Pulse Ox % 97  Pulse Ox Activity Level  At rest  Oxygen Delivery Room Air/ 21 %   Brief Assessment:  Cardiac Irregular   Respiratory coarse rhonchi intermittantly   Gastrointestinal details normal Soft  Nontender  Nondistended  Bowel sounds normal   EXTR positive edema   Lab Results: Routine Chem:  27-May-15 06:09   Glucose, Serum 93  BUN 13  Creatinine (comp) 0.74  Sodium, Serum 138  Potassium, Serum 4.2  Chloride, Serum 106  CO2, Serum 25  Calcium (Total), Serum 9.3  Anion Gap 7  Osmolality (calc) 275  eGFR (African American) >60  eGFR (Non-African American) >60 (eGFR values <70m/min/1.73 m2 may be an indication of chronic kidney disease (CKD). Calculated eGFR is useful in patients with stable renal function. The eGFR calculation will not be reliable in acutely ill patients when serum creatinine is changing rapidly. It is not useful in  patients on dialysis. The eGFR calculation may not be applicable to patients at the low and high extremes of body sizes, pregnant women, and vegetarians.)  Routine Hem:  27-May-15 06:09   WBC (CBC) 5.6  RBC (CBC)  4.12  Hemoglobin (CBC)  10.9  Hematocrit (CBC)  35.1  Platelet Count (CBC) 169  MCV 85  MCH 26.6  MCHC  31.2  RDW  16.1  Neutrophil % 60.9  Lymphocyte % 19.6  Monocyte % 9.5  Eosinophil % 9.1  Basophil % 0.9  Neutrophil # 3.4  Lymphocyte # 1.1  Monocyte # 0.5  Eosinophil # 0.5  Basophil # 0.1 (Result(s) reported on 05 May 2014 at 06:50AM.)   Assessment/Plan:   Assessment/Plan:  Assessment 1) dysphagia-multifactorial, presbyesophagus, spasm, narrowing in the settign of h/o multiple dilations for esophageal stricture.  2) medical issues including vascular dementia, bradycardia, AF, recent fall with presyncope.   Plan 1) case discussed with Dr HGinette Pitman family members, SLP and home SLP (Abigail Butts.  Apparently had been doing fairly well at home with eating.  Family have discussed the situation and wish to not pursue sedated proceedure at this time.  I have suggested, with information from SLP that we continue full liquids/protein supplements and pureed diet as tolerated for a month and then see in outpatient clinic for reassessment.   Electronic Signatures: SLoistine Simas(MD)  (Signed 27-May-15 14:40)  Authored: Chief Complaint, VITAL SIGNS/ANCILLARY NOTES, Brief Assessment, Lab Results, Assessment/Plan   Last Updated: 27-May-15 14:40 by SLoistine Simas(MD)

## 2015-04-02 NOTE — H&P (Signed)
PATIENT NAME:  Troy Troy Gonzales, Troy Troy Gonzales#:  [id number] DATE OF BIRTH:  [birth year]  DATE OF ADMISSION:  04/30/2014  PRIMARY CARE PHYSICIAN: Troy ReichmannVishwanath Hande, Gonzales   CHIEF COMPLAINT: Fall today and left lateral chest pain.   HISTORY OF PRESENT ILLNESS: Troy Troy Gonzales is an 81 year old pleasant Caucasian gentleman who has history of vascular dementia, anxiety, history of degenerative disk disease and history of atrial fibrillation, who comes into the Emergency Room after he had a fall this morning at home. The patient was trying to do vacuuming, his feet got tangled in the vacuum cord and the patient had a mechanical fall. He has been complaining of some left-sided chest pain, which is after the fall, and aggravated with coughing or palpating. CT of the head essentially negative other than old stroke in the past. CT cervical spine shows some DJD. Rib x-rays are pending. The patient had elevated troponin of 0.23, and hospitalist service was contacted for further evaluation.   PAST MEDICAL HISTORY:  1. Vascular dementia.  2. Cotton lung.  3. DJD.  4. Leaky valve.  5. Atrial fibrillation.  6. Hypertension.   PAST SURGICAL HISTORY:  1. Cataract.  2. Esophageal stricture that was stretched.  3. Partial lung removal.  4. Hernia repair x2.   ALLERGIES: PENICILLIN.   SOCIAL HISTORY: Lives alone, son visits every day. No alcohol or drug use. He used to work in [employer] work.   FAMILY HISTORY: Father with diabetes and peripheral vascular disease and amputation, died with diabetic complications. Mother died with leukemia.   MEDICATIONS:  1. Vitamin D3 10,000 international units once a week.  2. Vitamin B12 500 mcg p.o. b.i.d.  3. ProAir HFA 2 puffs.  4. Lisinopril 20 mg daily.  5. Escitalopram 5 mg daily at bedtime.  6. Aspirin 81 mg daily.  7. Artificial tears 1 drop to affected eye b.i.d.   REVIEW OF SYSTEMS:   CONSTITUTIONAL: No fever or fatigue. Positive for weakness and pain on the left  chest after fall.  EYES: No blurred or double vision, glaucoma.  ENT: No tinnitus, ear pain, hearing loss.  RESPIRATORY: No cough, wheeze, hemoptysis, asthma.  CARDIOVASCULAR: Left lateral rib pains. Positive for atrial fibrillation. No dyspnea on exertion or palpitations.  GASTROINTESTINAL: No nausea, vomiting, diarrhea, abdominal pain, GERD or melena.  GENITOURINARY: No dysuria, hematuria or frequency.  ENDOCRINE: No polyuria, nocturia or thyroid problems.  HEMATOLOGY: No anemia or easy bruising or bleeding.  SKIN: No acne or rash.  MUSCULOSKELETAL: Positive for arthritis.  NEUROLOGIC: No CVA, TIA or dysarthria. Positive for vascular dementia, chronic. PSYCHIATRIC: No anxiety, depression or bipolar disorder.  All other systems reviewed and negative.   DIAGNOSTIC STUDIES: EKG shows atrial fibrillation with PVCs. Right bundle and bifascicular block.  Troponin 0.23.  Chest x-ray shows enlargement of cardiac silhouette with pulmonary vascular congestion. Atherosclerotic calcification in aorta. Bone demineralized, without definite acute fracture. No acute definite infiltrate and pleural effusion, pneumothorax. Questionable new subtle 11 mm nodular focus in the right mid lung, not excluding developing right upper lobe nodule.  Comprehensive metabolic panel within normal limits except bilirubin of 2.1.  B-type natriuretic peptide is 4000.  CT of the head shows old right parietal infarct, unchanged. No new infarct noted.   ASSESSMENT AND PLAN: The 81 year old Troy Gonzales. Troy Troy Gonzales with history of atrial fibrillation, not on anticoagulation due to his falls, comes in with:   1. Fall, mechanical, today. The patient has developed left-sided chest pain after the fall. Will admit the  patient to orthopedic floor with off-unit monitor. Will continue p.r.n. pain meds. Will get rib x-rays to make sure the patient did not have any rib fracture. Continue pain management p.r.n. Incentive spirometer. Physical therapy  evaluation for fall and unsteady gait.  2. Chronic dizziness. The patient has been evaluated by neurology in the past. This could be part of his vascular dementia and strokes in the past. Will continue to monitor his symptoms. If no improvement, consider meclizine.  3. Elevated troponin, but not quite sure what to make of this. The patient has had elevated troponins higher than 0.2 in the past. The patient denies any chest pain otherwise, other than the chest pain that occurred after the fall. Will continue his aspirin. I will cycle cardiac enzymes x2. Hold off on cardiology consultation at this time.  4. Chronic atrial fibrillation, not on any rate blocking agent due to bradycardia in the past. His heart rate is under control. Will hold off on any beta blockers.  5. History of hypertension. Blood pressure is a little bit on the higher side. This could be due to pain. Will continue his lisinopril at this time.  6. Vascular dementia. 7. History of cotton mill lung. Continue the patient's inhalers.  8. Physical therapy for gait, ambulation and safety.  9. Care management for discharge planning.  10. Further workup according to the patient's clinical course. Hospital admission plan was discussed with the patient and the patient's family.   TIME SPENT: 50 minutes.  ____________________________ Troy Hail Troy Katz, Gonzales sap:lb D: 04/30/2014 13:09:54 ET T: 04/30/2014 13:50:42 ET JOB#: [id number]  cc: Troy Worthing A. Troy Katz, Gonzales, <Dictator> Troy Reichmann, Gonzales Troy Troy Gonzales ELECTRONICALLY SIGNED 05/03/2014 14:36

## 2015-04-02 NOTE — Discharge Summary (Signed)
PATIENT NAME:  Troy Gonzales, Troy Gonzales MR#:  [id number] DATE OF BIRTH:  Jul 19, 1923  DATE OF ADMISSION:  05/17/2014 DATE OF DISCHARGE:  05/19/2014  DIAGNOSES AT TIME OF DISCHARGE:  1.  Chest pain with mild elevation of troponin, most likely secondary to demand ischemia.  2.  Dizziness and hypotension secondary to medication and dehydration.  3.  Chronic atrial fibrillation.  4.  Dementia.  5.  History of recent fall.  6.  Chronic obstructive pulmonary disease.  7.  History of esophageal stricture with dysphagia.  8.  Possible left lung mass.   CHIEF COMPLAINT: Shortness of breath, dizziness and lightheadedness.  HISTORY OF PRESENT ILLNESS: Troy Gonzales is an 81 year old gentleman with a history of vascular dementia, degenerative joint disease, atrial fibrillation, hypertension and COPD, who had been recently discharged from the hospital and returned after he was noted to be hypotensive, subsequently became dizzy and lightheaded and also had some shortness of breath.   PAST MEDICAL HISTORY: Significant for vascular dementia, COPD, degenerative joint disease, A. fib, hypertension and esophageal stricture with dysphagia.   PAST SURGICAL HISTORY: Significant for esophageal dilatations, cataract surgery and hernia repair x 2. Please see H and P for other details.   HOSPITAL COURSE: The patient was admitted to [facility]. Cardiac enzymes were cycled and he was noted to have a mild elevation of troponin of 0.02, 0.19 and 0.15 respectively. He was seen also by cardiologist, Dr. Darrold JunkerParaschos, who felt that this was most likely secondary to demand ischemia. The patient was given IV fluids. His lisinopril was held and he was also seen by physical therapy. During his stay, he also had episodes of bradycardia and atrial fibrillation; however, he remained relatively asymptomatic and it was felt that he would benefit from physical therapy and short-term rehab. He was discharged in [facility] on  the following medications.  DISCHARGE MEDICATIONS:  Imdur 30 mg once a day; ranitidine 150 mg q.12 hours; ProAir HFA 2 puffs q.i.d.; aspirin 81 mg a day; vitamin D3 1 capsule once a week; vitamin B12 1 tablet b.i.d.; docusate sodium 100 mg twice a day; Ensure 240 mL twice a day; Percocet 5/325, 1 tablet q.12 hours p.r.n. for chest pain; ocular lubricant 1 drop to each affected eye 2 times a day for dry eyes and aspirin 81 mg a day.   FOLLOWUP: The patient has been advised to hold his lisinopril for now and to follow up with me, Dr. Marcello FennelHande, in the clinic in 1 to 2 weeks' time. Also advised to follow with Dr. Darrold JunkerParaschos, cardiologist, in 2 to 3 weeks' time. The patient was stable at the time of discharge.   TOTAL TIME SPENT IN DISCHARGING THE PATIENT: 35 minutes.  ____________________________ Barbette ReichmannVishwanath Addalee Kavanagh, MD vh:aw D: 05/19/2014 08:29:44 ET T: 05/19/2014 08:40:37 ET JOB#: [id number]  cc: Barbette ReichmannVishwanath Westin Knotts, MD, <Dictator> Barbette ReichmannVISHWANATH Jeannine Pennisi MD ELECTRONICALLY SIGNED 06/09/2014 18:09

## 2015-04-06 NOTE — H&P (Signed)
PATIENT NAME:  Troy Gonzales, [name] MR#:  [id number] DATE OF BIRTH:  26-Mar-1923  DATE OF ADMISSION:  12/10/2014  REFERRING EMERGENCY ROOM PHYSICIAN: Loraine LericheMark R. Fanny BienQuale, MD  PRIMARY CARE PHYSICIAN: Barbette ReichmannVishwanath Hande, MD with [facility].   CHIEF COMPLAINT: Chest pressure.   HISTORY OF PRESENT ILLNESS: This is a very pleasant 81 year old man with past medical history of coronary artery disease, dementia, atrial fibrillation, hypertension, and COPD, presents today with 1 day of substernal chest pressure. He reports some nausea and possible vomiting. No syncope. He feels generally weak. He has not had any diaphoresis. He reports that the symptoms are similar to his prior episodes of cardiac-type chest pain. He has not tried any medications at home for this. Nothing makes the pressure better or worse. He is not short of breath. He also reports that he is very constipated. His abdomen feels full and this may be contributing to his symptoms. He is not sure of his last bowel movement.   PAST MEDICAL HISTORY:  1.  Vascular dementia.  2.  COPD.  3.  Degenerative joint disease.  4.  Chronic atrial fibrillation.  5.  Hypertension.  6.  Esophageal stricture with dysphagia.   PAST SURGICAL HISTORY:  1.  Esophageal dilatation.  2.  Cataract surgery.  3.  Hernia repair x 2.   SOCIAL HISTORY: The patient lives with his son. He does not use a cane or walker. He does not drink alcohol, smoke cigarettes, or use any illicit substances. He quit smoking in 1998. He used to work in a [employer]. He is retired.   FAMILY MEDICAL HISTORY: Positive for diabetes, peripheral vascular disease, and leukemia.   ALLERGIES: HE IS ALLERGIC TO PENICILLIN WITH AN UNKNOWN REACTION.   HOME MEDICATIONS: Note that this list is incomplete and the family will be bringing in a list of his home medications.  1.  Vitamin D3 at 10,000 international units 1 capsule weekly.  2.  Vitamin B12 at 500 mcg 1 tablet twice a day.  3.  Ranitidine 150 mg 1  tablet every 12 hours.  4.  ProAir HFA CFC free 90 mcg/inhalations 2 puffs inhaled twice a day.  5.  Ocular lubricant 1 drop to each eye daily.  6.  Isosorbide mononitrate 30 mg 1 tablet daily.  7.  Ensure 240 mL twice a day.  8.  Docusate sodium 100 mg 1 capsule 2 times a day as needed for constipation.  9.  Aspirin 81 mg 1 tablet daily.  10.  Acetaminophen--oxycodone 325/5 mg 1 tablet every 12 hours as needed for pain.   REVIEW OF SYSTEMS: The patient has dementia and is unable to perform a full review of systems.   PHYSICAL EXAMINATION:  VITAL SIGNS: Temperature 96.9, pulse 59, respirations 18, blood pressure 167/93, oxygenation 100% on room air.  GENERAL: No acute distress, resting comfortably on the exam bed.  HEENT: Pupils equal, round, and reactive to light. Conjunctivae clear. Extraocular motion intact. Oral mucous membranes are dry. No dentition. He is edentulous. Posterior oropharynx is clear with no exudate, erythema, or edema.  NECK: Supple. Trachea midline. No cervical lymphadenopathy.  RESPIRATORY: Lungs are clear to auscultation bilaterally with good air movement.  CARDIOVASCULAR: Irregular, rate is controlled. There is a 3/6 systolic ejection murmur. No peripheral edema. Peripheral pulses are 1+.  ABDOMEN: Abdomen is full, nontender, nondistended. Bowel sounds are normal. No guarding, no hepatomegaly.  MUSCULOSKELETAL: There are no joint effusions and passive range of motion is normal. He does not  participate fully in the strength evaluation, but is able to move all extremities spontaneously.  NEUROLOGIC: Cranial nerves II through XII grossly intact. Strength and sensation seem to be intact and equal bilaterally. No focal deficits noted.  PSYCHIATRIC: The patient is alert. He is oriented to person and place, but not to date, showing no signs of uncontrolled depression or anxiety.   LABORATORY DATA: Sodium 141, potassium 4.0, chloride 108, bicarbonate 32, BUN 18, creatinine  1.03, glucose 124. BNP is 1904. Troponin is 0.19. CK-MB is 1.9. White blood cells 6.2, hemoglobin 12.8, platelets 179,000.   IMAGING: Abdomen shows a large amount of fecal matter throughout the colon including possible rectal fecal impaction. No acute findings otherwise. Chest x-ray shows chronic pulmonary scarring and emphysema.   ASSESSMENT AND PLAN:  1.  Chest pressure in a patient with known history of coronary artery disease: EKG shows no significant ST changes, shows an old bifascicular block. He is admitted to telemetry and EKG will be repeated in the morning. Troponin initially 0.19 and this has been seen in the past. We will continue to cycle enzymes. Continue on an aspirin, statin, and a nitrate. Start a low-dose beta blocker.  2.  Constipation: There is a large stool burden on KUB. He was disimpacted in the Emergency Department. He is receiving a Fleet Enema. We will continue with Senokot and MiraLax and milk of magnesia. Mobilization will be key, ambulation encouraged.  3.  Atrial fibrillation: Rate is controlled at this time. He does not seem to be on any anticoagulation at home.  4.  Hypertension: We will obtain his home medication list and continue antihypertensives.  5.  History of emphysema: Respiratory status is excellent at this time. Lungs are clear. Oxygenation is good. No signs of chronic obstructive pulmonary disease exacerbation.  6.  Gastroesophageal reflux disease: Continue with ranitidine.  7.  Hyperlipidemia: Continue with statin.  8.  Prophylaxis heparin for deep vein thrombosis prophylaxis. Ranitidine for gastrointestinal.   TIME SPENT ON ADMISSION: 50 minutes.     ____________________________ Ena Dawley. Clent Ridges, MD cpw:ts D: 12/10/2014 22:44:00 ET T: 12/10/2014 23:05:18 ET JOB#: [id number]  cc: Santina Evans P. Clent Ridges, MD, <Dictator> Gale Journey MD ELECTRONICALLY SIGNED 12/12/2014 20:11

## 2015-04-10 NOTE — Discharge Summary (Signed)
PATIENT NAME:  Troy Gonzales, Troy Gonzales MR#:  [id number] DATE OF BIRTH:  [birth year]  DATE OF ADMISSION:  12/10/2014 DATE OF DISCHARGE:  12/14/2014  FINAL DIAGNOSES: 1.  Noncardiac chest pain.  2.  Severe constipation.  3.  Metabolic encephalopathy secondary to dementia.  4.  Atrial fibrillation.  5.  Severe protein calorie malnutrition.  6.  Chronic obstructive pulmonary disease.  7.  Chronic dysphagia.  8.  Hypertension with history of autonomic hypotension.   HISTORY AND PHYSICAL: Please see dictated admission history and physical.   SUMMARY OF HOSPITAL COURSE: The patient was admitted with episodes of chest discomfort, which was difficult to evaluate secondary to his chronic complaints of chest discomfort. He also had had increasing complaints of constipation and abdominal films revealed significant abdominal stool burden. His cardiac enzymes were followed, with mild elevation of troponin, with MB normal. This was similar to previous pattern and appeared to be most consistent with demand ischemia.   He was placed on aggressive bowel regimen with improvement in bowel function, and improvement by abdominal film and by exam. His symptoms also seemed better. His atrial fibrillation was reasonably controlled and it was felt not to be a candidate for full anticoagulation secondary to dementia and fall risk. This was discussed with family members and they were in agreement.   He was evaluated by dietary, continued on supplements. Was seen by physical therapy and did reasonably well with this, although it was felt he would benefit from home health physical therapy. He has home health nursing and speech therapy in place already and these will be continued. He was kept on a puree diet and swallowing did reasonably well during this hospitalization.   He had worsening confusion, particularly at night, thought to be secondary to metabolic encephalopathy and "sundowning". It was hoped that this would begin to  improve once he was back into a more stable environment at home, although the possibility that this could continue was discussed at length with the family members and they are in agreement to continue watching closely. They have sitters in place and again have home health watching him and are very comfortable with home health services. At this point, he will be discharged to home in stable condition with his physical activity up with a walker as tolerated. He should stay on pureed diet, which will make regular for now, to try to increase his oral intake. Ensure should be given twice a day and this may be increased if necessary. Home health physical therapy, nursing, nurse's aide and speech therapy ordered for the patient as well with plans for the patient follow up with Dr. Marcello Fennel in 1 to 2 weeks.   DISCHARGE MEDICATIONS: 1.  Vitamin B12 500 mcg p.o. b.i.d.  2.  Vitamin D 10,000 units p.o. q. week.  3.  Aspirin 81 mg p.o. daily.  4.  ProAir HFA 2 puffs twice a day. 5.  Ensure b.i.d.  6.  Ocular lubricant 1 drop to the eyes b.i.d. as needed for dry eyes.  7.  Melatonin 5 mg p.o. at bedtime. 8.  Xanax 0.25 mg p.o. t.i.d. as needed for agitation.  9.  Pantoprazole 40 mg p.o. b.i.d.  10.  Quetiapine 25 mg p.o. at bedtime, new medication, side effects reviewed.  11.  Lopressor 25 mg p.o. b.i.d. to be held for heart rate less than 55 or systolic blood pressure less than 100.  12.  MiraLax 17 grams p.o. b.i.d. hold for loose stools.  13.  Senna  1 p.o. b.i.d. as needed for constipation.  At this time Norco and Midodrine were held. If his blood pressure becomes low, Midodrine may need to be reinstituted.   CODE STATUS: Palliative care will discuss this with the patient, family members were not able to come to an agreement, and this time he remains FULL code.   ____________________________ Lynnea FerrierBert J. Klein III, MD bjk:sb D: 12/14/2014 12:56:21 ET T: 12/14/2014 13:49:03 ET JOB#: [id number]  cc: Lynnea FerrierBert J.  Klein III, MD, <Dictator> Barbette ReichmannVishwanath Hande, MD Daniel NonesBERT KLEIN MD ELECTRONICALLY SIGNED 12/16/2014 8:07

## 2015-04-10 NOTE — Consult Note (Signed)
   Comments   Dr Phifer and I met with pt's son and daughter-in-law in the room. Son says that we should call his brother, Liliane Channel, who is pt's HCPOA.  then spoke with [facility] via phone. He relays to me concerns he has regarding pt's behaviors and poor oral intake, which have been ongoing for sometime. He recognizes that these are symptoms of pt's dementia and may worsen over time. Family plan to take patient home when he leaves the hospital. They have been hiring 24/7 sitters and are rotating family that stay with him at night. They also have home health following. I recommended that family consider speaking with the Dementia Support Team at [facility], which may be helpful in providing education and resources to help manage pt's agitation and decline.  spoke with Liliane Channel regarding code status. He has started speaking with his brothers about this topic and had actually asked the chaplain to help complete a living will while patient is hospitalized. Unfortunately, pt's confusion will likely exclude his participation in advance directives. Liliane Channel asks about a "Do Not Resuscitation" order, which we discussed at length. He wants to talk with his brothers about this before making a final decision.   Electronic Signatures: Borders, Kirt Boys (NP)  (Signed 04-Jan-16 15:35)  Authored: Palliative Care Phifer, Izora Gala (MD)  (Signed 04-Jan-16 16:04)  Authored: Palliative Care   Last Updated: 04-Jan-16 16:04 by Phifer, Izora Gala (MD)

## 2015-09-09 ENCOUNTER — Emergency Department
Admission: EM | Admit: 2015-09-09 | Discharge: 2015-09-09 | Disposition: A | Discharge status: Home / Self Care | Payer: Medicare Other | Source: Home / Self Care | Attending: Emergency Medicine | Admitting: Emergency Medicine

## 2015-09-09 ENCOUNTER — Encounter: Payer: Self-pay | Admitting: Emergency Medicine

## 2015-09-09 ENCOUNTER — Emergency Department: Payer: Medicare Other

## 2015-09-09 DIAGNOSIS — R110 Nausea: Secondary | ICD-10-CM

## 2015-09-09 DIAGNOSIS — N390 Urinary tract infection, site not specified: Secondary | ICD-10-CM

## 2015-09-09 LAB — COMPREHENSIVE METABOLIC PANEL
ALBUMIN: 4.4 g/dL (ref 3.5–5.0)
ALT: 11 U/L — ABNORMAL LOW (ref 17–63)
ANION GAP: 6 (ref 5–15)
AST: 25 U/L (ref 15–41)
Alkaline Phosphatase: 90 U/L (ref 38–126)
BILIRUBIN TOTAL: 1.6 mg/dL — AB (ref 0.3–1.2)
BUN: 22 mg/dL — ABNORMAL HIGH (ref 6–20)
CALCIUM: 9.8 mg/dL (ref 8.9–10.3)
CO2: 28 mmol/L (ref 22–32)
CREATININE: 1.19 mg/dL (ref 0.61–1.24)
Chloride: 106 mmol/L (ref 101–111)
GFR, EST NON AFRICAN AMERICAN: 52 mL/min — AB (ref >60)
Glucose, Bld: 105 mg/dL — ABNORMAL HIGH (ref 65–99)
POTASSIUM: 4.4 mmol/L (ref 3.5–5.1)
Sodium: 140 mmol/L (ref 135–145)
Total Protein: 7.8 g/dL (ref 6.5–8.1)

## 2015-09-09 LAB — TROPONIN I: Troponin I: 0.05 ng/mL — ABNORMAL HIGH (ref <0.031)

## 2015-09-09 LAB — CBC
HEMATOCRIT: 43.6 % (ref 40.0–52.0)
Hemoglobin: 13.8 g/dL (ref 13.0–18.0)
MCH: 27.4 pg (ref 26.0–34.0)
MCHC: 31.6 g/dL — AB (ref 32.0–36.0)
MCV: 86.7 fL (ref 80.0–100.0)
Platelets: 147 10*3/uL — ABNORMAL LOW (ref 150–440)
RBC: 5.03 MIL/uL (ref 4.40–5.90)
RDW: 15.9 % — AB (ref 11.5–14.5)
WBC: 9.9 10*3/uL (ref 3.8–10.6)

## 2015-09-09 LAB — URINALYSIS COMPLETE WITH MICROSCOPIC (ARMC ONLY)
BILIRUBIN URINE: NEGATIVE
Glucose, UA: NEGATIVE mg/dL
HGB URINE DIPSTICK: NEGATIVE
Ketones, ur: NEGATIVE mg/dL
Nitrite: NEGATIVE
PH: 5 (ref 5.0–8.0)
PROTEIN: 30 mg/dL — AB
Specific Gravity, Urine: 1.024 (ref 1.005–1.030)

## 2015-09-09 MED ORDER — ONDANSETRON HCL 4 MG/2ML IJ SOLN
4.0000 mg | Freq: Once | INTRAMUSCULAR | Status: AC
  Administered 2015-09-09: 4 mg via INTRAVENOUS
  Filled 2015-09-09: qty 2

## 2015-09-09 MED ORDER — CIPROFLOXACIN HCL 500 MG PO TABS: 500.0000 mg | ORAL_TABLET | Freq: Two times a day (BID) | ORAL | Status: DC

## 2015-09-09 MED ORDER — SODIUM CHLORIDE 0.9 % IV SOLN
1000.0000 mL | Freq: Once | INTRAVENOUS | Status: AC
  Administered 2015-09-09: 1000 mL via INTRAVENOUS

## 2015-09-09 MED ORDER — ASPIRIN 81 MG PO CHEW
324.0000 mg | CHEWABLE_TABLET | Freq: Once | ORAL | Status: AC
  Administered 2015-09-09: 324 mg via ORAL
  Filled 2015-09-09: qty 4

## 2015-09-09 MED ORDER — ONDANSETRON HCL 4 MG PO TABS: 4.0000 mg | ORAL_TABLET | Freq: Every day | ORAL | Status: DC | PRN

## 2015-09-09 NOTE — ED Provider Notes (Signed)
[facility] Emergency Department Provider Note  ____________________________________________  Time seen: On arrival  I have reviewed the triage vital signs and the nursing notes.   HISTORY  Chief Complaint Chest Pain; Nausea; and Diarrhea  Patient with a history of mild dementia  HPI Troy Gonzales is a 81 y.o. male who presents today with nausea and weakness. He feels rundown and has low energy. He also admits to some chest pain but reports he always has chest pain. He describes a burning in the center of his chest as mild. His sons report that the physical therapist checked his blood pressure and it was low when he was feeling lightheaded. He reports he is feeling better in the emergency department but still not quite back to baseline     Past Medical History  Diagnosis Date  . A-fib     There are no active problems to display for this patient.   History reviewed. No pertinent past surgical history.  Current Outpatient Rx  Name  Route  Sig  Dispense  Refill  . albuterol (PROVENTIL HFA;VENTOLIN HFA) 108 (90 BASE) MCG/ACT inhaler   Inhalation   Inhale 2 puffs into the lungs every 4 (four) hours as needed. For shortness of breath         . ALPRAZolam (XANAX) 0.25 MG tablet   Oral   Take 0.25 mg by mouth at bedtime as needed for anxiety. Half at tablet at 5 (right before supper) and other half at 8:30         . aspirin EC 81 MG tablet   Oral   Take 81 mg by mouth daily.         . cholecalciferol (VITAMIN D) 1000 UNITS tablet   Oral   Take 1,000 Units by mouth daily.         . Cholecalciferol (VITAMIN D3) 5000 UNITS TABS   Oral   Take 2 tablets by mouth every Saturday.         . clonazePAM (KLONOPIN) 1 MG tablet   Oral   Take 0.5-1 mg by mouth every morning.          . docusate sodium (COLACE) 100 MG capsule   Oral   Take 100-200 mg by mouth 2 (two) times daily.         . Melatonin 5 MG TABS   Oral   Take 1 tablet by  mouth at bedtime as needed.         . pantoprazole (PROTONIX) 40 MG tablet   Oral   Take 40 mg by mouth 2 (two) times daily.         . QUEtiapine (SEROQUEL) 25 MG tablet   Oral   Take 12.5-25 mg by mouth 2 (two) times daily. Take half a tablet in the morning and 1 tablet at bedtime         . sennosides-docusate sodium (SENOKOT-S) 8.6-50 MG tablet   Oral   Take 1 tablet by mouth daily.         . vitamin B-12 (CYANOCOBALAMIN) 500 MCG tablet   Oral   Take 500 mcg by mouth 2 (two) times daily.         Marland Kitchen acetaminophen (TYLENOL) 500 MG tablet   Oral   Take 1,000 mg by mouth at bedtime as needed. For pain         . ciprofloxacin (CIPRO) 500 MG tablet   Oral   Take 1 tablet (500 mg total) by mouth  2 (two) times daily.   20 tablet   0   . ondansetron (ZOFRAN) 4 MG tablet   Oral   Take 1 tablet (4 mg total) by mouth daily as needed for nausea or vomiting.   20 tablet   1     Allergies Codeine; Haldol; and Penicillins  No family history on file.  Social History Social History  Substance Use Topics  . Smoking status: Never Smoker   . Smokeless tobacco: None  . Alcohol Use: No    Review of Systems  Constitutional: Negative for fever. Eyes: Negative for visual changes. ENT: Negative for sore throat Cardiovascular: Positive for chest discomfort Respiratory: Negative for shortness of breath. Gastrointestinal: Negative for abdominal pain,  Genitourinary: Negative for dysuria. Musculoskeletal: Negative for back pain. Skin: Negative for rash. Neurological: Negative for headaches or focal weakness Psychiatric: No anxiety    ____________________________________________   PHYSICAL EXAM:  VITAL SIGNS: ED Triage Vitals  Enc Vitals Group     BP 09/09/15 0953 105/62 mmHg     Pulse Rate 09/09/15 0953 71     Resp 09/09/15 0953 22     Temp 09/09/15 0953 98.9 F (37.2 C)     Temp Source 09/09/15 0953 Oral     SpO2 09/09/15 0953 100 %     Weight 09/09/15  0953 150 lb (68.04 kg)     Height 09/09/15 0953  (1.803 m)     Head Cir --      Peak Flow --      Pain Score 09/09/15 0954 8     Pain Loc --      Pain Edu? --      Excl. in GC? --      Constitutional: Alert and oriented. Well appearing and in no distress. Eyes: Conjunctivae are normal.  ENT   Head: Normocephalic and atraumatic.   Mouth/Throat: Mucous membranes are moist. Cardiovascular: Normal rate, irregularly irregular rhythm Normal and symmetric distal pulses are present in all extremities. No murmurs, rubs, or gallops. Respiratory: Normal respiratory effort without tachypnea nor retractions. Breath sounds are clear and equal bilaterally.  Gastrointestinal: Soft and non-tender in all quadrants. No distention. There is no CVA tenderness. Genitourinary: deferred Musculoskeletal: Nontender with normal range of motion in all extremities. No lower extremity tenderness nor edema. Neurologic:  Normal speech and language. No gross focal neurologic deficits are appreciated. Skin:  Skin is warm, dry and intact. No rash noted. Psychiatric: Mood and affect are normal. Patient exhibits appropriate insight and judgment.  ____________________________________________    LABS (pertinent positives/negatives)  Labs Reviewed  CBC - Abnormal; Notable for the following:    MCHC 31.6 (*)    RDW 15.9 (*)    Platelets 147 (*)    All other components within normal limits  COMPREHENSIVE METABOLIC PANEL - Abnormal; Notable for the following:    Glucose, Bld 105 (*)    BUN 22 (*)    ALT 11 (*)    Total Bilirubin 1.6 (*)    GFR calc non Af Amer 52 (*)    All other components within normal limits  TROPONIN I - Abnormal; Notable for the following:    Troponin I 0.05 (*)    All other components within normal limits  URINALYSIS COMPLETEWITH MICROSCOPIC ([facility] ONLY) - Abnormal; Notable for the following:    Color, Urine AMBER (*)    APPearance HAZY (*)    Protein, ur 30 (*)     Leukocytes, UA 3+ (*)  Bacteria, UA RARE (*)    Squamous Epithelial / LPF 0-5 (*)    All other components within normal limits    ____________________________________________   EKG  ED ECG REPORT I, Jene Every, the attending physician, personally viewed and interpreted this ECG.   Date: 09/09/2015  EKG Time: 9:41 AM  Rate: 80  Rhythm: atrial fibrillation, rate 80  Axis: Normal  Intervals:right bundle branch block  ST&T Change: Nonspecific   ____________________________________________    RADIOLOGY I have personally reviewed any xrays that were ordered on this patient: Chest X-ray unremarkable  ____________________________________________   PROCEDURES  Procedure(s) performed: none  Critical Care performed: none  ____________________________________________   INITIAL IMPRESSION / ASSESSMENT AND PLAN / ED COURSE  Pertinent labs & imaging results that were available during my care of the patient were reviewed by me and considered in my medical decision making (see chart for details).  Patient overall well-appearing in the emergency department. His lab work is essentially unremarkable except for his mildly elevated troponin. Looking back over the years his troponin is chronically elevated and today is lower than usual. His EKG does not show any concerning signs for ACS and in fact he looks very well. He does seem to have an early urinary tract infection on his urinalysis. We will give fluids in the ED and reassess to determine disposition  After 1 L normal saline patient feels significantly better. He is chest pain-free and reports he feels his energy is returning. I discussed with he and his sons about discharge versus admission and eventually we settled on discharge given that he feels so well. Strongly urged returning to the emergency department if he develops further chest pain or more nausea.   ____________________________________________   FINAL CLINICAL  IMPRESSION(S) / ED DIAGNOSES  Final diagnoses:  UTI (lower urinary tract infection)  Nausea     Jene Every, MD 09/09/15 [id number]

## 2015-09-09 NOTE — ED Notes (Signed)
Pt has baseline dementia and lives with son.  Son reports showering pt this morning when pt became dizzy, nausea/vomiting and reported chest pain.  Physical therapist arrived to home shortly after and noted that the pt's BP was low at 80/50 and that patient was "extremely Short of Breath" while ambulating to the car, per son's report.

## 2015-09-09 NOTE — Discharge Instructions (Signed)

## 2015-09-09 NOTE — ED Notes (Signed)
Pt to ed with chest pain, n/d started last night.

## 2015-09-12 ENCOUNTER — Inpatient Hospital Stay: Payer: Medicare Other

## 2015-09-12 ENCOUNTER — Inpatient Hospital Stay
Admission: AD | Admit: 2015-09-12 | Discharge: 2015-09-16 | DRG: 392 | Disposition: A | Discharge status: Skilled Nursing Facility | Payer: Medicare Other | Source: Ambulatory Visit | Attending: Internal Medicine | Admitting: Internal Medicine

## 2015-09-12 DIAGNOSIS — F03918 Unspecified dementia, unspecified severity, with other behavioral disturbance: Secondary | ICD-10-CM | POA: Insufficient documentation

## 2015-09-12 DIAGNOSIS — Z880 Allergy status to penicillin: Secondary | ICD-10-CM | POA: Diagnosis not present

## 2015-09-12 DIAGNOSIS — M419 Scoliosis, unspecified: Secondary | ICD-10-CM | POA: Diagnosis present

## 2015-09-12 DIAGNOSIS — N390 Urinary tract infection, site not specified: Secondary | ICD-10-CM | POA: Diagnosis present

## 2015-09-12 DIAGNOSIS — Z8673 Personal history of transient ischemic attack (TIA), and cerebral infarction without residual deficits: Secondary | ICD-10-CM | POA: Diagnosis not present

## 2015-09-12 DIAGNOSIS — F0391 Unspecified dementia with behavioral disturbance: Secondary | ICD-10-CM | POA: Insufficient documentation

## 2015-09-12 DIAGNOSIS — I248 Other forms of acute ischemic heart disease: Secondary | ICD-10-CM | POA: Diagnosis present

## 2015-09-12 DIAGNOSIS — I482 Chronic atrial fibrillation: Secondary | ICD-10-CM | POA: Diagnosis present

## 2015-09-12 DIAGNOSIS — R531 Weakness: Secondary | ICD-10-CM

## 2015-09-12 DIAGNOSIS — J449 Chronic obstructive pulmonary disease, unspecified: Secondary | ICD-10-CM | POA: Diagnosis present

## 2015-09-12 DIAGNOSIS — Z79899 Other long term (current) drug therapy: Secondary | ICD-10-CM

## 2015-09-12 DIAGNOSIS — R112 Nausea with vomiting, unspecified: Secondary | ICD-10-CM | POA: Diagnosis present

## 2015-09-12 DIAGNOSIS — K228 Other specified diseases of esophagus: Secondary | ICD-10-CM | POA: Diagnosis present

## 2015-09-12 DIAGNOSIS — D649 Anemia, unspecified: Secondary | ICD-10-CM | POA: Diagnosis present

## 2015-09-12 DIAGNOSIS — F0151 Vascular dementia with behavioral disturbance: Secondary | ICD-10-CM | POA: Diagnosis present

## 2015-09-12 DIAGNOSIS — R197 Diarrhea, unspecified: Secondary | ICD-10-CM | POA: Diagnosis present

## 2015-09-12 DIAGNOSIS — Z66 Do not resuscitate: Secondary | ICD-10-CM | POA: Diagnosis present

## 2015-09-12 DIAGNOSIS — R1310 Dysphagia, unspecified: Secondary | ICD-10-CM | POA: Diagnosis present

## 2015-09-12 DIAGNOSIS — E876 Hypokalemia: Secondary | ICD-10-CM | POA: Diagnosis present

## 2015-09-12 DIAGNOSIS — Z888 Allergy status to other drugs, medicaments and biological substances status: Secondary | ICD-10-CM | POA: Diagnosis not present

## 2015-09-12 DIAGNOSIS — Z7982 Long term (current) use of aspirin: Secondary | ICD-10-CM

## 2015-09-12 DIAGNOSIS — I959 Hypotension, unspecified: Secondary | ICD-10-CM | POA: Diagnosis present

## 2015-09-12 DIAGNOSIS — Z885 Allergy status to narcotic agent status: Secondary | ICD-10-CM

## 2015-09-12 DIAGNOSIS — R1010 Upper abdominal pain, unspecified: Secondary | ICD-10-CM | POA: Diagnosis present

## 2015-09-12 HISTORY — DX: Cerebral infarction, unspecified: I63.9

## 2015-09-12 HISTORY — DX: Chronic obstructive pulmonary disease, unspecified: J44.9

## 2015-09-12 HISTORY — DX: Unspecified dementia, unspecified severity, without behavioral disturbance, psychotic disturbance, mood disturbance, and anxiety: F03.90

## 2015-09-12 LAB — CBC WITH DIFFERENTIAL/PLATELET
BASOS PCT: 0 %
Basophils Absolute: 0 10*3/uL (ref 0–0.1)
EOS ABS: 0.4 10*3/uL (ref 0–0.7)
Eosinophils Relative: 7 %
HCT: 37.5 % — ABNORMAL LOW (ref 40.0–52.0)
HEMOGLOBIN: 11.7 g/dL — AB (ref 13.0–18.0)
LYMPHS ABS: 0.9 10*3/uL — AB (ref 1.0–3.6)
Lymphocytes Relative: 19 %
MCH: 27.3 pg (ref 26.0–34.0)
MCHC: 31.3 g/dL — ABNORMAL LOW (ref 32.0–36.0)
MCV: 87.3 fL (ref 80.0–100.0)
Monocytes Absolute: 0.3 10*3/uL (ref 0.2–1.0)
Monocytes Relative: 7 %
NEUTROS PCT: 67 %
Neutro Abs: 3.2 10*3/uL (ref 1.4–6.5)
Platelets: 120 10*3/uL — ABNORMAL LOW (ref 150–440)
RBC: 4.29 MIL/uL — AB (ref 4.40–5.90)
RDW: 16.5 % — ABNORMAL HIGH (ref 11.5–14.5)
WBC: 4.9 10*3/uL (ref 3.8–10.6)

## 2015-09-12 LAB — COMPREHENSIVE METABOLIC PANEL
ALT: 12 U/L — AB (ref 17–63)
AST: 26 U/L (ref 15–41)
Albumin: 3.4 g/dL — ABNORMAL LOW (ref 3.5–5.0)
Alkaline Phosphatase: 58 U/L (ref 38–126)
Anion gap: 6 (ref 5–15)
BUN: 20 mg/dL (ref 6–20)
CHLORIDE: 107 mmol/L (ref 101–111)
CO2: 25 mmol/L (ref 22–32)
CREATININE: 1.08 mg/dL (ref 0.61–1.24)
Calcium: 8.9 mg/dL (ref 8.9–10.3)
GFR, EST NON AFRICAN AMERICAN: 58 mL/min — AB (ref >60)
Glucose, Bld: 78 mg/dL (ref 65–99)
POTASSIUM: 4.2 mmol/L (ref 3.5–5.1)
Sodium: 138 mmol/L (ref 135–145)
Total Bilirubin: 1 mg/dL (ref 0.3–1.2)
Total Protein: 6.3 g/dL — ABNORMAL LOW (ref 6.5–8.1)

## 2015-09-12 LAB — TROPONIN I
Troponin I: 0.05 ng/mL — ABNORMAL HIGH (ref <0.031)
Troponin I: 0.05 ng/mL — ABNORMAL HIGH (ref <0.031)

## 2015-09-12 LAB — TSH: TSH: 0.343 u[IU]/mL — ABNORMAL LOW (ref 0.350–4.500)

## 2015-09-12 MED ORDER — QUETIAPINE FUMARATE 25 MG PO TABS
25.0000 mg | ORAL_TABLET | Freq: Every day | ORAL | Status: DC
  Administered 2015-09-12: 25 mg via ORAL
  Filled 2015-09-12: qty 1

## 2015-09-12 MED ORDER — PANTOPRAZOLE SODIUM 40 MG IV SOLR
40.0000 mg | Freq: Two times a day (BID) | INTRAVENOUS | Status: DC
  Administered 2015-09-12 (×2): 40 mg via INTRAVENOUS
  Filled 2015-09-12 (×2): qty 40

## 2015-09-12 MED ORDER — SODIUM CHLORIDE 0.9 % IV SOLN
INTRAVENOUS | Status: DC
  Administered 2015-09-12 – 2015-09-15 (×6): via INTRAVENOUS

## 2015-09-12 MED ORDER — ALPRAZOLAM 1 MG PO TABS
1.0000 mg | ORAL_TABLET | Freq: Every evening | ORAL | Status: DC | PRN
  Administered 2015-09-12: 1 mg via ORAL
  Filled 2015-09-12: qty 1

## 2015-09-12 MED ORDER — CLONAZEPAM 0.5 MG PO TABS
0.5000 mg | ORAL_TABLET | Freq: Every day | ORAL | Status: DC
  Administered 2015-09-12 – 2015-09-16 (×5): 0.5 mg via ORAL
  Filled 2015-09-12 (×5): qty 1

## 2015-09-12 MED ORDER — ENOXAPARIN SODIUM 40 MG/0.4ML ~~LOC~~ SOLN: 40.0000 mg | SUBCUTANEOUS | Status: DC

## 2015-09-12 MED ORDER — DEXTROSE 5 % IV SOLN
1.0000 g | INTRAVENOUS | Status: DC
  Administered 2015-09-12 – 2015-09-15 (×4): 1 g via INTRAVENOUS
  Filled 2015-09-12 (×5): qty 10

## 2015-09-12 NOTE — Consult Note (Signed)
Subjective: Patient seen for nausea vomiting, diarrhea and dysphagia. Patient seen and examined. Please see full GI consult by Ms. Richards.  Objective: Vital signs in last 24 hours: Temp:  [97.8 F (36.6 C)-98.6 F (37 C)] 98.6 F (37 C) (10/03 1927) Pulse Rate:  [54-69] 69 (10/03 1927) Resp:  [17-18] 18 (10/03 1927) BP: (119-136)/(56-62) 119/56 mmHg (10/03 1927) SpO2:  [99 %-100 %] 100 % (10/03 1927) Weight:  [65.545 kg (144 lb 8 oz)] 65.545 kg (144 lb 8 oz) (10/03 1337) Blood pressure 119/56, pulse 69, temperature 98.6 F (37 C), temperature source Oral, resp. rate 18, height  (1.803 m), weight 65.545 kg (144 lb 8 oz), SpO2 100 %.   Intake/Output from previous day:    Intake/Output this shift:     General appearance:  Elderly 81 year old male no acute distress Resp:  Clear to auscultation Cardio:  Regular rate and rhythm GI:  Soft mild generalized discomfort to palpation no rebound or masses noted Extremities:  No clubbing cyanosis or edema   Lab Results: Results for orders placed or performed during the hospital encounter of 09/12/15 (from the past 24 hour(s))  Comprehensive metabolic panel     Status: Abnormal   Collection Time: 09/12/15  1:40 PM  Result Value Ref Range   Sodium 138 135 - 145 mmol/L   Potassium 4.2 3.5 - 5.1 mmol/L   Chloride 107 101 - 111 mmol/L   CO2 25 22 - 32 mmol/L   Glucose, Bld 78 65 - 99 mg/dL   BUN 20 6 - 20 mg/dL   Creatinine, Ser 6.04 0.61 - 1.24 mg/dL   Calcium 8.9 8.9 - 54.0 mg/dL   Total Protein 6.3 (L) 6.5 - 8.1 g/dL   Albumin 3.4 (L) 3.5 - 5.0 g/dL   AST 26 15 - 41 U/L   ALT 12 (L) 17 - 63 U/L   Alkaline Phosphatase 58 38 - 126 U/L   Total Bilirubin 1.0 0.3 - 1.2 mg/dL   GFR calc non Af Amer 58 (L) >60 mL/min   GFR calc Af Amer >60 >60 mL/min   Anion gap 6 5 - 15  CBC WITH DIFFERENTIAL     Status: Abnormal   Collection Time: 09/12/15  1:40 PM  Result Value Ref Range   WBC 4.9 3.8 - 10.6 K/uL   RBC 4.29 (L) 4.40 -  5.90 MIL/uL   Hemoglobin 11.7 (L) 13.0 - 18.0 g/dL   HCT 98.1 (L) 19.1 - 47.8 %   MCV 87.3 80.0 - 100.0 fL   MCH 27.3 26.0 - 34.0 pg   MCHC 31.3 (L) 32.0 - 36.0 g/dL   RDW 29.5 (H) 62.1 - 30.8 %   Platelets 120 (L) 150 - 440 K/uL   Neutrophils Relative % 67 %   Neutro Abs 3.2 1.4 - 6.5 K/uL   Lymphocytes Relative 19 %   Lymphs Abs 0.9 (L) 1.0 - 3.6 K/uL   Monocytes Relative 7 %   Monocytes Absolute 0.3 0.2 - 1.0 K/uL   Eosinophils Relative 7 %   Eosinophils Absolute 0.4 0 - 0.7 K/uL   Basophils Relative 0 %   Basophils Absolute 0.0 0 - 0.1 K/uL  TSH     Status: Abnormal   Collection Time: 09/12/15  1:40 PM  Result Value Ref Range   TSH 0.343 (L) 0.350 - 4.500 uIU/mL  Troponin I     Status: Abnormal   Collection Time: 09/12/15  1:40 PM  Result Value Ref Range  Troponin I 0.05 (H) <0.031 ng/mL  Troponin I     Status: Abnormal   Collection Time: 09/12/15  7:14 PM  Result Value Ref Range   Troponin I 0.05 (H) <0.031 ng/mL      Recent Labs  09/12/15 1340  WBC 4.9  HGB 11.7*  HCT 37.5*  PLT 120*   BMET  Recent Labs  09/12/15 1340  NA 138  K 4.2  CL 107  CO2 25  GLUCOSE 78  BUN 20  CREATININE 1.08  CALCIUM 8.9   LFT  Recent Labs  09/12/15 1340  PROT 6.3*  ALBUMIN 3.4*  AST 26  ALT 12*  ALKPHOS 58  BILITOT 1.0   PT/INR No results for input(s): LABPROT, INR in the last 72 hours. Hepatitis Panel No results for input(s): HEPBSAG, HCVAB, HEPAIGM, HEPBIGM in the last 72 hours. C-Diff No results for input(s): CDIFFTOX in the last 72 hours. No results for input(s): CDIFFPCR in the last 72 hours.   Studies/Results: X-ray Chest Pa And Lateral  09/12/2015   CLINICAL DATA:  Chest pain and shortness of breath, nausea and vomiting, atrial fibrillation, COPD, dementia, prior stroke  EXAM: CHEST  2 VIEW  COMPARISON:  09/09/2015  FINDINGS: Rotated to the LEFT.  Upper normal heart size.  Atherosclerotic calcification aorta.  Mediastinal contours and pulmonary  vascularity normal.  Emphysematous changes with biapical scarring greater on LEFT.  Chronic interstitial prominence slightly greater at lung bases, stable.  No acute infiltrate, pleural effusion or pneumothorax.  Dextro convex thoracic scoliosis.  Bones demineralized.  IMPRESSION: COPD changes with chronic interstitial disease and biapical scarring.  No acute abnormalities.   Electronically Signed   By: Ulyses Southward M.D.   On: 09/12/2015 14:18   Dg Abd 2 Views  09/12/2015   CLINICAL DATA:  Acute chest pain shortness of breath. Nausea and vomiting. History of atrial fibrillation, COPD  EXAM: ABDOMEN - 2 VIEW  COMPARISON:  12/13/2014  FINDINGS: scattered air and stool throughout the bowel. Small scattered air fluid levels on the upright exam. Negative for obstruction or ileus. No free air. Monitor leads overlie the upper abdomen. Chronic basilar fibrotic changes noted. Degenerative changes of the spine with an associated scoliosis. Bones are osteopenic.  IMPRESSION: Nonobstructive bowel gas pattern.  Chronic findings as above.   Electronically Signed   By: Judie Petit.  Shick M.D.   On: 09/12/2015 14:19    Scheduled Inpatient Medications:   . cefTRIAXone (ROCEPHIN)  IV  1 g Intravenous Q24H  . clonazePAM  0.5 mg Oral Daily  . enoxaparin (LOVENOX) injection  40 mg Subcutaneous Q24H  . pantoprazole (PROTONIX) IV  40 mg Intravenous Q12H  . QUEtiapine  25 mg Oral QHS    Continuous Inpatient Infusions:   . sodium chloride 100 mL/hr at 09/12/15 1538    PRN Inpatient Medications:  ALPRAZolam  Miscellaneous:   Assessment:  1. Nausea vomiting diarrheal illness. This could be related with his urinary tract infection versus acute gastroenteritis. Of note that family members state that they have been having "bug" passed around at home. 2. Dysphagia. 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  Plan:  1. SLP evaluation 2. Stool for culture and sensitivity as well as Clostridium difficile. 3.  Family currently not wishing to proceed with sedated procedure. He seemed be doing well at home with a pured diet. Will await results of SLP evaluation for further evaluation or recommendations.Christena Deem MD 09/12/2015, 8:43 PM

## 2015-09-12 NOTE — Progress Notes (Signed)
PT Cancellation Note  Patient Details Name: CHEIKH BRAMBLE MRN: [id number] DOB: 1923/04/13   Cancelled Treatment:    Reason Eval/Treat Not Completed: Patient at procedure or test/unavailable (Attempted to see pt twice; once he was gone for an x-ray, second time nurse was preparign to administer an IV. Will re-attempt eval at a later time/date.)   Georgina Peer 09/12/2015, 3:18 PM

## 2015-09-12 NOTE — Progress Notes (Signed)
Alert to self. IVF going. Room air. A fib. High fall risk. Takes meds ok. Pt has not reported any pain. Family at the bedside. Code status was updated. Pt has no further concerns at this time.

## 2015-09-12 NOTE — Progress Notes (Signed)
Skin checked byTanya B RN  

## 2015-09-12 NOTE — Evaluation (Signed)
Physical Therapy Evaluation Patient Details Name: Troy Gonzales MRN: [id number] DOB: 1923/05/03 Today's Date: 09/12/2015   History of Present Illness  Troy Gonzales is a 81 y.o. male who presents today with nausea and weakness. He feels rundown and has low energy. He also admits to some chest pain but reports he always has chest pain. He describes a burning in the center of his chest as mild. His sons report that the physical therapist checked his blood pressure and it was low when he was feeling lightheaded.  Clinical Impression  Upon evaluation, pt displays good motivation for performance of activity. Pt is independent with all transfers (supine to sitting and sit <>stand). Offered pt a walker to use for sit<>stand transfer and to use with gait, but pt refused. Pt was able to display safety and steadiness on his feet with ambulation. Walked ~200 ft with PT supervision. Pt did report some mild dizziness at the end of the 200 ft. Pt educated that if this should happen when at home, he should find some sort of sturdy piece of furniture/something to hold onto and wait until the dizziness subsides in order to minimize risk of falling. Pt was accepting and verbalized understanding of this information. Pt would benefit continuing to have HHPT in order to increase level of functional endurance and strength and to monitor any changes in activity tolerance/ overall medical status.     Follow Up Recommendations Home health PT    Equipment Recommendations       Recommendations for Other Services       Precautions / Restrictions Precautions Precautions: None Restrictions Weight Bearing Restrictions: No      Mobility  Bed Mobility Overal bed mobility: Independent             General bed mobility comments: pt able to transfer supine <>sitting without PT assistance or using bed rails  Transfers Overall transfer level: Independent               General transfer comment: Provided the  walker to the pt but he refused to use it. Able to perform sit<>stand independently  Ambulation/Gait Ambulation/Gait assistance: Supervision Ambulation Distance (Feet): 200 Feet Assistive device: None Gait Pattern/deviations: WFL(Within Functional Limits)   Gait velocity interpretation: at or above normal speed for age/gender General Gait Details: pt did well with gait, pt provided supervision only. Pt stated that he got dizzy at the end of ~200 ft ambulation. Pt required a rest break. Educated pt on importance of finding something sturdy to hold on to if this should happen when at home, stopping all mobility until dizziness subsides in order to avoid falling.   Stairs            Wheelchair Mobility    Modified Rankin (Stroke Patients Only)       Balance Overall balance assessment: Independent                                           Pertinent Vitals/Pain Pain Assessment:  (states he has pain but did not state where)    Home Living Family/patient expects to be discharged to:: Private residence Living Arrangements: Alone (children come by to help out) Available Help at Discharge: Family Type of Home: House Home Access: Stairs to enter Entrance Stairs-Rails: Can reach both Entrance Stairs-Number of Steps: 2 Home Layout: One level Home Equipment: Cane - single  point;Grab bars - tub/shower      Prior Function Level of Independence: Independent with assistive device(s)         Comments: Pt does not use cane when in the house     Hand Dominance        Extremity/Trunk Assessment   Upper Extremity Assessment: Overall WFL for tasks assessed           Lower Extremity Assessment: Overall WFL for tasks assessed (Bilat LE strength 4/5)         Communication   Communication: HOH  Cognition Arousal/Alertness: Awake/alert Behavior During Therapy: WFL for tasks assessed/performed Overall Cognitive Status: Within Functional Limits for tasks  assessed                      General Comments      Exercises        Assessment/Plan    PT Assessment Patient needs continued PT services  PT Diagnosis Other (comment);Generalized weakness   PT Problem List Decreased activity tolerance  PT Treatment Interventions Therapeutic activities;Therapeutic exercise;Patient/family education   PT Goals (Current goals can be found in the Care Plan section) Acute Rehab PT Goals Patient Stated Goal: to go home PT Goal Formulation: With patient Time For Goal Achievement: 09/26/15 Potential to Achieve Goals: Good    Frequency Min 2X/week   Barriers to discharge        Co-evaluation               End of Session Equipment Utilized During Treatment: Gait belt Activity Tolerance: Patient tolerated treatment well Patient left: in bed;with nursing/sitter in room           Time: 1610-9604 PT Time Calculation (min) (ACUTE ONLY): 12 min   Charges:         PT G CodesGeorgina Peer 09/12/2015, 4:01 PM

## 2015-09-12 NOTE — Consult Note (Signed)
GI Inpatient Consult Note  Reason for Consult:  Nausea , vomiting, dysphagia   Attending Requesting Consult:  Dr. Marcello Fennel  History of Present Illness: Troy Gonzales is a 81 y.o. male  is being evaluated for nausea and vomiting, upper abdominal pain and loose stools  for the past 3 days.  Patient's 2 sons are present in the room and provide history due to patient's advanced dementia.  Son reports that patient has  not been able to keep any liquids or food down since Friday, unable to take medications.  He reports that patient has known esophageal stricture.  Patient has been evaluated for this in the past, son reports his esophagus has been stretched 7 times.  Son reports last time patient had food stuck he had to have an upper endoscopy which ended up causing him to be under anesthesia for 3 hours due to the  extensive nature of the foreign body obstruction.  Family states that they are concerned for further sedated procedures [id number] to further cognitive decline of the patient.  The patient has speech therapy come to the house.  Son reports that speech therapist will instruct the patient to take 3 small sips of water, patient is unable to do so.  Patient also becomes combative when given commands.  Son reports that patient will start to take small sips but then begins to drink too fast, becomes choked and then vomits food and liquids back up.  Both son's report a significant change (decline) in his vascular dementia over the past 3-4 months.  Son reports loose stools as black in color, many episodes a day, gave patient Imodium with relief.  Last bowel movement was earlier today.  Son reports sick contacts at home, says they have a stomach bug they have been passing around.  The son reports patient complains that his stomach hurts, and will point to his belly button area. The patient takes Protonix 40 mg twice a day.  Patient was hospitalized in May 2015, at that time our office was consulted for  difficulty swallowing, history of esophageal stricture.  The plan at that time was to continue speech therapy recommendations, nothing more than a pureed diet, Protonix therapy.  It is to be noted that EGD it was noted to possibly be of benefit, however, due to family concerns in the patient's high risk due to his comorbidities and some cardiac issues he was not completed.  Past Medical History:  Past Medical History  Diagnosis Date  . A-fib (HCC)   . COPD (chronic obstructive pulmonary disease) (HCC)   . Dementia   . Stroke [facility])     Problem List: Patient Active Problem List   Diagnosis Date Noted  . Weakness 09/12/2015    Past Surgical History: History reviewed. No pertinent past surgical history.  Allergies: Allergies  Allergen Reactions  . Codeine   . Haldol [Haloperidol Lactate]   . Penicillins     Home Medications: Prescriptions prior to admission  Medication Sig Dispense Refill Last Dose  . albuterol (PROVENTIL HFA;VENTOLIN HFA) 108 (90 BASE) MCG/ACT inhaler Inhale 2 puffs into the lungs every 4 (four) hours as needed. For shortness of breath   04/23/2012 at Unknown  . ALPRAZolam (XANAX) 0.25 MG tablet Take 0.25 mg by mouth at bedtime as needed for anxiety. Half at tablet at 5 (right before supper) and other half at 8:30     . aspirin EC 81 MG tablet Take 81 mg by mouth daily.   09/08/2015 at  Unknown time  . Cholecalciferol (VITAMIN D3) 5000 UNITS TABS Take 2 tablets by mouth every Saturday.   Past Week at Unknown time  . ciprofloxacin (CIPRO) 500 MG tablet Take 1 tablet (500 mg total) by mouth 2 (two) times daily. 20 tablet 0   . clonazePAM (KLONOPIN) 1 MG tablet Take 0.5-1 mg by mouth every morning.      . docusate sodium (COLACE) 100 MG capsule Take 100-200 mg by mouth 2 (two) times daily.   09/08/2015 at Unknown time  . Melatonin 5 MG TABS Take 1 tablet by mouth at bedtime as needed.     . pantoprazole (PROTONIX) 40 MG tablet Take 40 mg by mouth 2 (two) times daily.    09/08/2015 at Unknown time  . sennosides-docusate sodium (SENOKOT-S) 8.6-50 MG tablet Take 1 tablet by mouth daily.   09/08/2015 at Unknown time  . vitamin B-12 (CYANOCOBALAMIN) 500 MCG tablet Take 500 mcg by mouth 2 (two) times daily.     . [DISCONTINUED] acetaminophen (TYLENOL) 500 MG tablet Take 1,000 mg by mouth at bedtime as needed. For pain   04/22/2012 at Unknown  . [DISCONTINUED] cholecalciferol (VITAMIN D) 1000 UNITS tablet Take 1,000 Units by mouth daily.   09/08/2015 at Unknown time  . [DISCONTINUED] ondansetron (ZOFRAN) 4 MG tablet Take 1 tablet (4 mg total) by mouth daily as needed for nausea or vomiting. 20 tablet 1   . [DISCONTINUED] QUEtiapine (SEROQUEL) 25 MG tablet Take 12.5-25 mg by mouth 2 (two) times daily. Take half a tablet in the morning and 1 tablet at bedtime      Home medication reconciliation was completed with the patient.   Scheduled Inpatient Medications:   . cefTRIAXone (ROCEPHIN)  IV  1 g Intravenous Q24H  . clonazePAM  0.5 mg Oral Daily  . enoxaparin (LOVENOX) injection  40 mg Subcutaneous Q24H  . pantoprazole (PROTONIX) IV  40 mg Intravenous Q12H  . QUEtiapine  25 mg Oral QHS    Continuous Inpatient Infusions:   . sodium chloride      PRN Inpatient Medications:  ALPRAZolam  Family History: family history is not on file.    Social History:   reports that he has never smoked. He does not have any smokeless tobacco history on file. He reports that he does not drink alcohol or use illicit drugs.   Review of Systems: Constitutional: weight loss reported  Eyes: No changes in vision. ENT: No oral lesions, sore throat.  GI: see HPI.  Heme/Lymph: No easy bruising.  CV: No chest pain.  GU: No hematuria.  Integumentary: No rashes.  Neuro: No headaches.  Psych: No depression/anxiety.  Endocrine: No heat/cold intolerance.  Allergic/Immunologic: No urticaria.  Resp: No cough, SOB.  Musculoskeletal: No joint swelling.    Physical Examination: BP  136/62 mmHg  Pulse 54  Temp(Src) 97.8 F (36.6 C)  Resp 17  Wt 65.545 kg (144 lb 8 oz)  SpO2 99% Gen: NAD,   History is provided by 2 sons were present in the room due to patient's dementia HEENT: PEERLA, EOMI, Neck: supple, no JVD or thyromegaly Chest: CTA bilaterally, no wheezes, crackles, or other adventitious sounds CV: RRR, no m/g/c/r Abd: soft, epigastric tenderness, ND, +BS in all four quadrants; no HSM, guarding, ridigity, or rebound tenderness Ext: no edema, well perfused with 2+ pulses, Skin: no rash or lesions noted Lymph: no LAD  Data: Lab Results  Component Value Date   WBC 4.9 09/12/2015   HGB 11.7* 09/12/2015   HCT  37.5* 09/12/2015   MCV 87.3 09/12/2015   PLT 120* 09/12/2015    Recent Labs Lab 09/09/15 1025 09/12/15 1340  HGB 13.8 11.7*   Lab Results  Component Value Date   NA 140 09/09/2015   K 4.4 09/09/2015   CL 106 09/09/2015   CO2 28 09/09/2015   BUN 22* 09/09/2015   CREATININE 1.19 09/09/2015   Lab Results  Component Value Date   ALT 11* 09/09/2015   AST 25 09/09/2015   ALKPHOS 90 09/09/2015   BILITOT 1.6* 09/09/2015   No results for input(s): APTT, INR, PTT in the last 168 hours.   Imaging:  CLINICAL DATA: Acute chest pain shortness of breath. Nausea and vomiting. History of atrial fibrillation, COPD  EXAM: ABDOMEN - 2 VIEW  COMPARISON: 12/13/2014  FINDINGS: scattered air and stool throughout the bowel. Small scattered air fluid levels on the upright exam. Negative for obstruction or ileus. No free air. Monitor leads overlie the upper abdomen. Chronic basilar fibrotic changes noted. Degenerative changes of the spine with an associated scoliosis. Bones are osteopenic.  IMPRESSION: Nonobstructive bowel gas pattern. Chronic findings as above.   Electronically Signed  By: Judie Petit. Shick M.D.  On: 09/12/2015 14:19  CLINICAL DATA: Chest pain and shortness of breath, nausea and vomiting, atrial fibrillation, COPD,  dementia, prior stroke  EXAM: CHEST 2 VIEW  COMPARISON: 09/09/2015  FINDINGS: Rotated to the LEFT.  Upper normal heart size.  Atherosclerotic calcification aorta.  Mediastinal contours and pulmonary vascularity normal.  Emphysematous changes with biapical scarring greater on LEFT.  Chronic interstitial prominence slightly greater at lung bases, stable.  No acute infiltrate, pleural effusion or pneumothorax.  Dextro convex thoracic scoliosis.  Bones demineralized.  IMPRESSION: COPD changes with chronic interstitial disease and biapical scarring.  No acute abnormalities.   Electronically Signed  By: Ulyses Southward M.D.  On: 09/12/2015 14:18  Assessment/Plan: Mr. Lazenby is a 81 y.o. male with nausea, vomiting, loose stools for the past three days, along with chronic dysphagia with know esophageal stricture.Hgb  11.7, MCV 87.3, platelets 120, BUN 22, Creatinine 1.19, troponin 0.05.  Is also being treated for UTI with Rocephin.  Recommendations: We recommend waiting on SLP evaluation for further insight to dysphagia.  Patients family does not want to proceed with any type of sedated procedure at this time.  We agree with continuing with symptomatic treatment for nausea and vomiting.  We recommend stool culture and sensitivity, testing for C diff.  We will continue to follow with you.  Thank you for the consult. Please call with questions or concerns.  Oneil Behney S Richards,PA-C  I personally performed these services.
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
## 2015-09-13 DIAGNOSIS — F03918 Unspecified dementia, unspecified severity, with other behavioral disturbance: Secondary | ICD-10-CM | POA: Insufficient documentation

## 2015-09-13 DIAGNOSIS — F0391 Unspecified dementia with behavioral disturbance: Secondary | ICD-10-CM

## 2015-09-13 LAB — BASIC METABOLIC PANEL
Anion gap: 3 — ABNORMAL LOW (ref 5–15)
BUN: 18 mg/dL (ref 6–20)
CALCIUM: 8.4 mg/dL — AB (ref 8.9–10.3)
CO2: 27 mmol/L (ref 22–32)
CREATININE: 0.95 mg/dL (ref 0.61–1.24)
Chloride: 113 mmol/L — ABNORMAL HIGH (ref 101–111)
GFR calc non Af Amer: >60 mL/min (ref >60)
Glucose, Bld: 85 mg/dL (ref 65–99)
Potassium: 3.7 mmol/L (ref 3.5–5.1)
SODIUM: 143 mmol/L (ref 135–145)

## 2015-09-13 LAB — CBC
HCT: 30.7 % — ABNORMAL LOW (ref 40.0–52.0)
Hemoglobin: 9.8 g/dL — ABNORMAL LOW (ref 13.0–18.0)
MCH: 27.4 pg (ref 26.0–34.0)
MCHC: 31.9 g/dL — AB (ref 32.0–36.0)
MCV: 86 fL (ref 80.0–100.0)
Platelets: 111 10*3/uL — ABNORMAL LOW (ref 150–440)
RBC: 3.57 MIL/uL — ABNORMAL LOW (ref 4.40–5.90)
RDW: 16 % — AB (ref 11.5–14.5)
WBC: 4.1 10*3/uL (ref 3.8–10.6)

## 2015-09-13 LAB — TROPONIN I: TROPONIN I: 0.05 ng/mL — AB (ref <0.031)

## 2015-09-13 MED ORDER — PANTOPRAZOLE SODIUM 40 MG PO TBEC
40.0000 mg | DELAYED_RELEASE_TABLET | Freq: Two times a day (BID) | ORAL | Status: DC
  Administered 2015-09-13 – 2015-09-16 (×6): 40 mg via ORAL
  Filled 2015-09-13 (×7): qty 1

## 2015-09-13 MED ORDER — LORAZEPAM 2 MG/ML IJ SOLN
1.0000 mg | Freq: Once | INTRAMUSCULAR | Status: AC
  Administered 2015-09-13: 1 mg via INTRAVENOUS
  Filled 2015-09-13: qty 1

## 2015-09-13 MED ORDER — DEXTROSE 50 % IV SOLN
INTRAVENOUS | Status: AC
  Filled 2015-09-13: qty 50

## 2015-09-13 MED ORDER — ALPRAZOLAM 0.25 MG PO TABS
0.2500 mg | ORAL_TABLET | Freq: Every day | ORAL | Status: DC
  Administered 2015-09-13 – 2015-09-14 (×2): 0.25 mg via ORAL
  Filled 2015-09-13 (×3): qty 1

## 2015-09-13 MED ORDER — QUETIAPINE FUMARATE 25 MG PO TABS
25.0000 mg | ORAL_TABLET | Freq: Three times a day (TID) | ORAL | Status: DC
  Administered 2015-09-13 – 2015-09-16 (×7): 25 mg via ORAL
  Filled 2015-09-13 (×8): qty 1

## 2015-09-13 MED ORDER — HALOPERIDOL LACTATE 5 MG/ML IJ SOLN: 1.0000 mg | Freq: Four times a day (QID) | INTRAMUSCULAR | Status: DC | PRN

## 2015-09-13 NOTE — Progress Notes (Signed)
Pt very confused trying to get out of bed, even with sitter at bedside uncontrollable. Ativan IV given per MD orders, bed alarm on . Will monitor closely

## 2015-09-13 NOTE — Progress Notes (Addendum)
Physical Therapy Treatment Patient Details Name: MCKENZIE TORUNO MRN: [id number] DOB: Jun 19, 1923 Today's Date: 09/13/2015    History of Present Illness Troy Gonzales is a 81 y.o. male who presents today with nausea and weakness. He feels rundown and has low energy. He also admits to some chest pain but reports he always has chest pain. He describes a burning in the center of his chest as mild. His sons report that the physical therapist checked his blood pressure and it was low when he was feeling lightheaded.    PT Comments    Compared to performance at time of evaluation; pt demonstrated a significant decline in functional and cognitive status over the last 24 hours. Pt appears to fairly confused and disoriented to time and place and would frequently make nonsensical statements. Pt declined in all aspects of functional mobility performance (bed mobility, transfers, ambulation). When ambulation pt would reach for any object within arms distance to use as support; whereas yesterday he was able to ambulate 200 ft without an assistive device. Pt was given his SPC to use for assistance with gait, but he was unable to grasp the concept of how to properly use the cane. Due to this recent decline in functional and cognitive status our d/c recommendation has changed from home with HHPT to SNF. Pt requires close medical supervision around the clock at this time in order to prevent a serious fall/injury.  Follow Up Recommendations  SNF     Equipment Recommendations  Rolling walker with 5" wheels    Recommendations for Other Services       Precautions / Restrictions Precautions Precautions: Fall Precaution Comments: Pt severely confused this afternoon and poses a significant fall risk Restrictions Weight Bearing Restrictions: No    Mobility  Bed Mobility Overal bed mobility: Needs Assistance;Modified Independent Bed Mobility: Supine to Sit     Supine to sit: Modified independent  (Device/Increase time)     General bed mobility comments: pt used bed rail for transfer supine<>sit  Transfers Overall transfer level: Needs assistance Equipment used: None;1 person hand held assist;Straight cane Transfers: Sit to/from Stand Sit to Stand: Modified independent (Device/Increase time)         General transfer comment: Pt demonstrated decline in functional status compared to yesterday: pt required hand held assist with sit<>stand transfer  Ambulation/Gait Ambulation/Gait assistance: Mod assist Ambulation Distance (Feet): 15 Feet Assistive device: 1 person hand held assist;Straight cane Gait Pattern/deviations: Staggering right;Staggering left   Gait velocity interpretation: Below normal speed for age/gender General Gait Details: When up and attempting to ambulate pt would constantly reach for items in room to help balance himself (yesterday he ambulated without assistive device x 213ft), pt was given SPC to use but was unable to demonstrate proper use of cane due to confused state. One instance of mod assist provided by PT to prevent pt from falling after he staggered toward his R.    Stairs            Wheelchair Mobility    Modified Rankin (Stroke Patients Only)       Balance Overall balance assessment: Needs assistance Sitting-balance support: Feet supported;Bilateral upper extremity supported Sitting balance-Leahy Scale: Fair     Standing balance support: During functional activity;Single extremity supported Standing balance-Leahy Scale: Poor Standing balance comment: see gait analysis                    Cognition Arousal/Alertness: Suspect due to medications Behavior During Therapy: Restless;Agitated Overall Cognitive  Status: Impaired/Different from baseline Area of Impairment: Orientation;Attention;Safety/judgement;Problem solving;Following commands Orientation Level: Time;Place;Disoriented to       Safety/Judgement: Decreased  awareness of safety   Problem Solving: Slow processing      Exercises      General Comments        Pertinent Vitals/Pain Pain Assessment: No/denies pain    Home Living                      Prior Function            PT Goals (current goals can now be found in the care plan section) Acute Rehab PT Goals Patient Stated Goal: to go home PT Goal Formulation: With patient Time For Goal Achievement: 09/26/15 Potential to Achieve Goals: Fair Progress towards PT goals: Not progressing toward goals - comment (pt displayed decline in functional status today)    Frequency  Min 2X/week    PT Plan Discharge plan needs to be updated    Co-evaluation             End of Session Equipment Utilized During Treatment: Gait belt Activity Tolerance:  (treatment limited due to apparent change in psychological status ) Patient left: in bed;with bed alarm set;with nursing/sitter in room     Time: 1421-1438 PT Time Calculation (min) (ACUTE ONLY): 17 min  Charges:                       G CodesGeorgina Peer 09/13/2015, 3:42 PM

## 2015-09-13 NOTE — Consult Note (Signed)
[facility] Consult   Reason for Consult:  Consult for this 81 year old man with a history of dementia because of agitation Referring Physician:  Hande Patient Identification: Troy Gonzales MRN:  [id number] Principal Diagnosis: Dementia Diagnosis:   Patient Active Problem List   Diagnosis Date Noted  . Weakness [R53.1] 09/12/2015    Total Time spent with patient: 45 minutes  Subjective:   Troy Gonzales is a 81 y.o. male patient admitted with "I don't know, you tell me". Patient was not able to give much useful history.Marland Kitchen  HPI:  Information from the chart and recent interaction with the patient and discussion with nursing staff. 81 year old man with a history of multiple medical problems who is currently in the hospital because of nausea and vomiting and GI complaints. He has been resistant to evaluation and workup and has been aggressive and appears to be confused at times. Nursing staff reports that he is frequently aggressive and very difficult to work with. Family has reported that he has become more aggressive at home as well. Patient has no insight into this. He tells me that he does not know why he is in the hospital right now. He denies any current physical complaints. Patient denies feeling depressed. Denies suicidal or homicidal ideation. Denies having any hallucinations. To my evaluation he was oriented to the fact that he was in the hospital but had no idea about the date and could not give me any history about his current medical treatment.  Past psychiatric history: Identified history of dementia. Also evidently he is treated with benzodiazepine's regularly as an outpatient. Recent addition of quetiapine as well. No known history of psychiatric hospitalizations no history of suicide attempts.  Medical history: Patient has had esophageal strictures and multiple got problems over the years including esophageal blockage requiring direct intervention. History of  COPD.  Family history: Patient was not able to give much information not known whether there is any significant family history.  Social history: I'm not entirely clear from looking at the chart what his living situation is. The patient tells me he lives at home with his family. He can't tell me which members of his family actually live at home. He may be correct about still living at home it looks like he is gotten home health services. He evidently has several adult children who are engaged in assisting with care.  Substance abuse history: Denies any history of alcohol or drug abuse  Past Psychiatric History: Patient has a history of dementia that has been documented years. No other known psychiatric illness.  Risk to Self: Is patient at risk for suicide?: No Risk to Others:   Prior Inpatient Therapy:   Prior Outpatient Therapy:    Past Medical History:  Past Medical History  Diagnosis Date  . A-fib ([place])   . COPD (chronic obstructive pulmonary disease) ([place])   . Dementia   . Stroke [facility])    History reviewed. No pertinent past surgical history. Family History: History reviewed. No pertinent family history. Family Psychiatric  History: No known family history can be identified Social History:  History  Alcohol Use No     History  Drug Use No    Social History   Social History  . Marital Status: Widowed    Spouse Name: N/A  . Number of Children: N/A  . Years of Education: N/A   Social History Main Topics  . Smoking status: Never Smoker   . Smokeless tobacco: None  . Alcohol  Use: No  . Drug Use: No  . Sexual Activity: Not Asked   Other Topics Concern  . None   Social History Narrative   Additional Social History:                          Allergies:   Allergies  Allergen Reactions  . Codeine   . Haldol [Haloperidol Lactate]   . Penicillins     Labs:  Results for orders placed or performed during the hospital encounter of 09/12/15 (from the past  48 hour(s))  Urine culture     Status: None (Preliminary result)   Collection Time: 09/12/15  1:15 PM  Result Value Ref Range   Specimen Description URINE, CLEAN CATCH    Special Requests Normal    Culture NO GROWTH < 24 HOURS    Report Status PENDING   Comprehensive metabolic panel     Status: Abnormal   Collection Time: 09/12/15  1:40 PM  Result Value Ref Range   Sodium 138 135 - 145 mmol/L   Potassium 4.2 3.5 - 5.1 mmol/L   Chloride 107 101 - 111 mmol/L   CO2 25 22 - 32 mmol/L   Glucose, Bld 78 65 - 99 mg/dL   BUN 20 6 - 20 mg/dL   Creatinine, Ser 1.08 0.61 - 1.24 mg/dL   Calcium 8.9 8.9 - 10.3 mg/dL   Total Protein 6.3 (L) 6.5 - 8.1 g/dL   Albumin 3.4 (L) 3.5 - 5.0 g/dL   AST 26 15 - 41 U/L   ALT 12 (L) 17 - 63 U/L   Alkaline Phosphatase 58 38 - 126 U/L   Total Bilirubin 1.0 0.3 - 1.2 mg/dL   GFR calc non Af Amer 58 (L) >60 mL/min   GFR calc Af Amer >60 >60 mL/min    Comment: (NOTE) The eGFR has been calculated using the CKD EPI equation. This calculation has not been validated in all clinical situations. eGFR's persistently <60 mL/min signify possible Chronic Kidney Disease.    Anion gap 6 5 - 15  CBC WITH DIFFERENTIAL     Status: Abnormal   Collection Time: 09/12/15  1:40 PM  Result Value Ref Range   WBC 4.9 3.8 - 10.6 K/uL   RBC 4.29 (L) 4.40 - 5.90 MIL/uL   Hemoglobin 11.7 (L) 13.0 - 18.0 g/dL   HCT 37.5 (L) 40.0 - 52.0 %   MCV 87.3 80.0 - 100.0 fL   MCH 27.3 26.0 - 34.0 pg   MCHC 31.3 (L) 32.0 - 36.0 g/dL   RDW 16.5 (H) 11.5 - 14.5 %   Platelets 120 (L) 150 - 440 K/uL   Neutrophils Relative % 67 %   Neutro Abs 3.2 1.4 - 6.5 K/uL   Lymphocytes Relative 19 %   Lymphs Abs 0.9 (L) 1.0 - 3.6 K/uL   Monocytes Relative 7 %   Monocytes Absolute 0.3 0.2 - 1.0 K/uL   Eosinophils Relative 7 %   Eosinophils Absolute 0.4 0 - 0.7 K/uL   Basophils Relative 0 %   Basophils Absolute 0.0 0 - 0.1 K/uL  TSH     Status: Abnormal   Collection Time: 09/12/15  1:40 PM   Result Value Ref Range   TSH 0.343 (L) 0.350 - 4.500 uIU/mL  Troponin I     Status: Abnormal   Collection Time: 09/12/15  1:40 PM  Result Value Ref Range   Troponin I 0.05 (H) <0.031 ng/mL  Comment: READ BACK AND VERIFIED DOLL FERGUSON ON 09/12/15 AT 1620 BY TB.        PERSISTENTLY INCREASED TROPONIN VALUES IN THE RANGE OF 0.04-0.49 ng/mL CAN BE SEEN IN:       -UNSTABLE ANGINA       -CONGESTIVE HEART FAILURE       -MYOCARDITIS       -CHEST TRAUMA       -ARRYHTHMIAS       -LATE PRESENTING MYOCARDIAL INFARCTION       -COPD   CLINICAL FOLLOW-UP RECOMMENDED.   Troponin I     Status: Abnormal   Collection Time: 09/12/15  7:14 PM  Result Value Ref Range   Troponin I 0.05 (H) <0.031 ng/mL    Comment: READ BACK AND VERIFIED WITH MARCELL TURNER ON 09/12/15 AT 2032 PM BY TB        PERSISTENTLY INCREASED TROPONIN VALUES IN THE RANGE OF 0.04-0.49 ng/mL CAN BE SEEN IN:       -UNSTABLE ANGINA       -CONGESTIVE HEART FAILURE       -MYOCARDITIS       -CHEST TRAUMA       -ARRYHTHMIAS       -LATE PRESENTING MYOCARDIAL INFARCTION       -COPD   CLINICAL FOLLOW-UP RECOMMENDED.   Troponin I     Status: Abnormal   Collection Time: 09/13/15  4:38 AM  Result Value Ref Range   Troponin I 0.05 (H) <0.031 ng/mL    Comment: RESULTS PREVIOUSLY CALLED TO MARCELL TURNER AT 2032 ON 09/12/15 BY TB...[place]        PERSISTENTLY INCREASED TROPONIN VALUES IN THE RANGE OF 0.04-0.49 ng/mL CAN BE SEEN IN:       -UNSTABLE ANGINA       -CONGESTIVE HEART FAILURE       -MYOCARDITIS       -CHEST TRAUMA       -ARRYHTHMIAS       -LATE PRESENTING MYOCARDIAL INFARCTION       -COPD   CLINICAL FOLLOW-UP RECOMMENDED.   CBC     Status: Abnormal   Collection Time: 09/13/15  4:38 AM  Result Value Ref Range   WBC 4.1 3.8 - 10.6 K/uL   RBC 3.57 (L) 4.40 - 5.90 MIL/uL   Hemoglobin 9.8 (L) 13.0 - 18.0 g/dL   HCT 30.7 (L) 40.0 - 52.0 %   MCV 86.0 80.0 - 100.0 fL   MCH 27.4 26.0 - 34.0 pg   MCHC 31.9 (L) 32.0 -  36.0 g/dL   RDW 16.0 (H) 11.5 - 14.5 %   Platelets 111 (L) 150 - 440 K/uL  Basic metabolic panel     Status: Abnormal   Collection Time: 09/13/15  4:38 AM  Result Value Ref Range   Sodium 143 135 - 145 mmol/L   Potassium 3.7 3.5 - 5.1 mmol/L   Chloride 113 (H) 101 - 111 mmol/L   CO2 27 22 - 32 mmol/L   Glucose, Bld 85 65 - 99 mg/dL   BUN 18 6 - 20 mg/dL   Creatinine, Ser 0.95 0.61 - 1.24 mg/dL   Calcium 8.4 (L) 8.9 - 10.3 mg/dL   GFR calc non Af Amer >60 >60 mL/min   GFR calc Af Amer >60 >60 mL/min    Comment: (NOTE) The eGFR has been calculated using the CKD EPI equation. This calculation has not been validated in all clinical situations. eGFR's persistently <60 mL/min signify possible Chronic Kidney Disease.  Anion gap 3 (L) 5 - 15    Current Facility-Administered Medications  Medication Dose Route Frequency Provider Last Rate Last Dose  . 0.9 %  sodium chloride infusion   Intravenous Continuous Tracie Harrier, MD 50 mL/hr at 09/13/15 1215    . ALPRAZolam (XANAX) tablet 0.25 mg  0.25 mg Oral QHS Gonzella Lex, MD      . cefTRIAXone (ROCEPHIN) 1 g in dextrose 5 % 50 mL IVPB  1 g Intravenous Q24H Vishwanath Hande, MD   1 g at 09/13/15 1431  . clonazePAM (KLONOPIN) tablet 0.5 mg  0.5 mg Oral Daily Vishwanath Hande, MD   0.5 mg at 09/13/15 0843  . dextrose 50 % solution           . haloperidol lactate (HALDOL) injection 1 mg  1 mg Intravenous Q6H PRN Gonzella Lex, MD      . pantoprazole (PROTONIX) EC tablet 40 mg  40 mg Oral BID Loleta Dicker, RPH   40 mg at 09/13/15 0844  . QUEtiapine (SEROQUEL) tablet 25 mg  25 mg Oral QHS Tracie Harrier, MD   25 mg at 09/12/15 2221    Musculoskeletal: Strength & Muscle Tone: decreased Gait & Station: unable to stand Patient leans: N/A  Psychiatric Specialty Exam: Review of Systems  Constitutional: Negative.   HENT: Negative.   Eyes: Negative.   Respiratory: Negative.   Cardiovascular: Negative.   Gastrointestinal:  Negative.   Musculoskeletal: Negative.   Skin: Negative.   Neurological: Negative.   Psychiatric/Behavioral: Negative for depression, suicidal ideas, hallucinations and substance abuse. The patient is not nervous/anxious and does not have insomnia.     Blood pressure 152/76, pulse 62, temperature 97.5 F (36.4 C), temperature source Oral, resp. rate 18, height 5' 11"  (1.803 m), weight 65.545 kg (144 lb 8 oz), SpO2 94 %.Body mass index is 20.16 kg/(m^2).  General Appearance: Disheveled  Eye Sport and exercise psychologist::  Fair  Speech:  Slow  Volume:  Decreased  Mood:  Anxious  Affect:  Labile  Thought Process:  Loose  Orientation:  Negative  Thought Content:  Negative  Suicidal Thoughts:  No  Homicidal Thoughts:  No  Memory:  Immediate;   Fair Recent;   Poor Remote;   Fair  Judgement:  Impaired  Insight:  Shallow  Psychomotor Activity:  Decreased  Concentration:  Poor  Recall:  Poor  Fund of Knowledge:Fair  Language: Fair  Akathisia:  No  Handed:  Right  AIMS (if indicated):     Assets:  Communication Skills Desire for Improvement Social Support  ADL's:  Impaired  Cognition: Impaired,  Moderate  Sleep:      Treatment Plan Summary: Medication management and Plan Patient with dementia has been more agitated and resistant to treatment. Currently still getting benzodiazepine's although it looks like the doses were altered a little bit when he came into the hospital. He is getting some Seroquel. I had initially intended to add when necessary doses of Haldol but it appears that that is listed as an allergy. I will discontinue the when necessary Haldol and suggest that we increase the dose of Seroquel to 25 mg and try and do that 3 times a day while continuing him getting his low dose of a quarter milligram of Xanax at night. I will follow-up as needed. Hopefully we will not over shoot and make him too sedated. It can be hard to find the exact correct dose that will allow for the patient to be  cooperative with treatment  without making them too sedated but we'll try and come up with something. I'm not yet clear from the chart what his social situation is and don't know what the appropriate disposition would be. Case management is working on that.  Disposition: No evidence of imminent risk to self or others at present.   Patient does not meet criteria for psychiatric inpatient admission. Supportive therapy provided about ongoing stressors. Discussed crisis plan, support from social network, calling 911, coming to the Emergency Department, and calling Suicide Hotline.  John Clapacs 09/13/2015 7:48 PM

## 2015-09-13 NOTE — Progress Notes (Signed)
Speech Therapy Note: received order, reviewed chart notes and consulted NSG who reported pt is remaining agitated and not calm for taking po's at this time. Pt has received meds for his agitation overnight. The behavior problems are suspected to be related to his Dementia. Pt is currently on a full liquid diet at this time sec. To the N/V he experienced at admission(per GI order). ST will f/u w/ toleration of such when pt is calm and awake to take po's. NSG agreed. Rec. Strict aspiration precautions.

## 2015-09-13 NOTE — Consult Note (Signed)
Subjective: Patient seen for nausea, vomiting, diarrhea, dysphagia. No diarrhea or bowel movement since admission. No recurrent emesis since admission. Patient would not allow examination this evening. Does not seem to be uncomfortable it becomes combative threatens to become combative by behavior.  Objective: Vital signs in last 24 hours: Temp:  [98.3 F (36.8 C)] 98.3 F (36.8 C) (10/04 0619) Pulse Rate:  [56-138] 56 (10/04 1127) Resp:  [18] 18 (10/04 1127) BP: (115-140)/(76-86) 140/76 mmHg (10/04 1127) SpO2:  [97 %] 97 % (10/04 0619) Blood pressure 140/76, pulse 56, temperature 98.3 F (36.8 C), temperature source Oral, resp. rate 18, height  (1.803 m), weight 65.545 kg (144 lb 8 oz), SpO2 97 %.   Intake/Output from previous day: 10/03 0701 - 10/04 0700 In: 800 [I.V.:800] Out: 100 [Urine:100]  Intake/Output this shift:     General appearance:   Resp:   Cardio:   GI:   Extremities:     Lab Results: Results for orders placed or performed during the hospital encounter of 09/12/15 (from the past 24 hour(s))  Troponin I     Status: Abnormal   Collection Time: 09/13/15  4:38 AM  Result Value Ref Range   Troponin I 0.05 (H) <0.031 ng/mL  CBC     Status: Abnormal   Collection Time: 09/13/15  4:38 AM  Result Value Ref Range   WBC 4.1 3.8 - 10.6 K/uL   RBC 3.57 (L) 4.40 - 5.90 MIL/uL   Hemoglobin 9.8 (L) 13.0 - 18.0 g/dL   HCT 78.4 (L) 69.6 - 29.5 %   MCV 86.0 80.0 - 100.0 fL   MCH 27.4 26.0 - 34.0 pg   MCHC 31.9 (L) 32.0 - 36.0 g/dL   RDW 28.4 (H) 13.2 - 44.0 %   Platelets 111 (L) 150 - 440 K/uL  Basic metabolic panel     Status: Abnormal   Collection Time: 09/13/15  4:38 AM  Result Value Ref Range   Sodium 143 135 - 145 mmol/L   Potassium 3.7 3.5 - 5.1 mmol/L   Chloride 113 (H) 101 - 111 mmol/L   CO2 27 22 - 32 mmol/L   Glucose, Bld 85 65 - 99 mg/dL   BUN 18 6 - 20 mg/dL   Creatinine, Ser 1.02 0.61 - 1.24 mg/dL   Calcium 8.4 (L) 8.9 - 10.3 mg/dL   GFR calc  non Af Amer >60 >60 mL/min   GFR calc Af Amer >60 >60 mL/min   Anion gap 3 (L) 5 - 15      Recent Labs  09/12/15 1340 09/13/15 0438  WBC 4.9 4.1  HGB 11.7* 9.8*  HCT 37.5* 30.7*  PLT 120* 111*   BMET  Recent Labs  09/12/15 1340 09/13/15 0438  NA 138 143  K 4.2 3.7  CL 107 113*  CO2 25 27  GLUCOSE 78 85  BUN 20 18  CREATININE 1.08 0.95  CALCIUM 8.9 8.4*   LFT  Recent Labs  09/12/15 1340  PROT 6.3*  ALBUMIN 3.4*  AST 26  ALT 12*  ALKPHOS 58  BILITOT 1.0   PT/INR No results for input(s): LABPROT, INR in the last 72 hours. Hepatitis Panel No results for input(s): HEPBSAG, HCVAB, HEPAIGM, HEPBIGM in the last 72 hours. C-Diff No results for input(s): CDIFFTOX in the last 72 hours. No results for input(s): CDIFFPCR in the last 72 hours.   Studies/Results: X-ray Chest Pa And Lateral  09/12/2015   CLINICAL DATA:  Chest pain and shortness of breath,  nausea and vomiting, atrial fibrillation, COPD, dementia, prior stroke  EXAM: CHEST  2 VIEW  COMPARISON:  09/09/2015  FINDINGS: Rotated to the LEFT.  Upper normal heart size.  Atherosclerotic calcification aorta.  Mediastinal contours and pulmonary vascularity normal.  Emphysematous changes with biapical scarring greater on LEFT.  Chronic interstitial prominence slightly greater at lung bases, stable.  No acute infiltrate, pleural effusion or pneumothorax.  Dextro convex thoracic scoliosis.  Bones demineralized.  IMPRESSION: COPD changes with chronic interstitial disease and biapical scarring.  No acute abnormalities.   Electronically Signed   By: Ulyses Southward M.D.   On: 09/12/2015 14:18   Dg Abd 2 Views  09/12/2015   CLINICAL DATA:  Acute chest pain shortness of breath. Nausea and vomiting. History of atrial fibrillation, COPD  EXAM: ABDOMEN - 2 VIEW  COMPARISON:  12/13/2014  FINDINGS: scattered air and stool throughout the bowel. Small scattered air fluid levels on the upright exam. Negative for obstruction or ileus. No free  air. Monitor leads overlie the upper abdomen. Chronic basilar fibrotic changes noted. Degenerative changes of the spine with an associated scoliosis. Bones are osteopenic.  IMPRESSION: Nonobstructive bowel gas pattern.  Chronic findings as above.   Electronically Signed   By: Judie Petit.  Shick M.D.   On: 09/12/2015 14:19    Scheduled Inpatient Medications:   . cefTRIAXone (ROCEPHIN)  IV  1 g Intravenous Q24H  . clonazePAM  0.5 mg Oral Daily  . dextrose      . pantoprazole  40 mg Oral BID  . QUEtiapine  25 mg Oral QHS    Continuous Inpatient Infusions:   . sodium chloride 50 mL/hr at 09/13/15 1215    PRN Inpatient Medications:  ALPRAZolam  Miscellaneous:   Assessment:  1. Nausea vomiting and diarrheal illness currently not in evidence. 2. Dysphagia known history of distal esophageal stenosis however previously was tolerating soft/pured foods. He would not allow SLP evaluation today. 3. Progressive mental status changes  Plan:  1. Awaiting psych consult 2. Awaiting speech evaluation. If this can be accomplished perhaps swallow would also be done. Following with you.  Christena Deem MD 09/13/2015, 7:33 PM

## 2015-09-13 NOTE — Progress Notes (Signed)
Pt became increasingly confused and combative. Pt threatened to hit the staff and kicked a Psychologist, sport and exercise. We called a code 300 and the pt. Settled a little. MD notified and prescribed a one time dose of Ativan. Pt. Tolerated the dose of ativan and is now calm and asleep.

## 2015-09-13 NOTE — Progress Notes (Signed)
Key Points: Use following P&T approved IV to PO antibiotic change policy.  Description contains the criteria that are approved  CONCERNING: IV to Oral Route Change Policy  RECOMMENDATION: This patient is receiving pantoprazole by the intravenous route.  Based on criteria approved by the Pharmacy and Therapeutics Committee, the intravenous medication(s) is/are being converted to the equivalent oral dose form(s).   DESCRIPTION: These criteria include:  The patient is eating (either orally or via tube) and/or has been taking other orally administered medications for a least 24 hours  The patient has no evidence of active gastrointestinal bleeding or impaired GI absorption (gastrectomy, short bowel, patient on TNA or NPO).  If you have questions about this conversion, please contact the Pharmacy Department    [phone] )  [facility]   [phone] )  [facility]   [phone] )  [facility]   [phone] )  [facility]   [phone] )  [facility]   [place], [facility] 09/13/2015 7:45 AM

## 2015-09-13 NOTE — Progress Notes (Signed)
PROGRESS NOTE  Troy Gonzales ZOX:[id number] DOB: 07/19/1923 DOA: 09/12/2015 PCP: Barbette Reichmann, MD  Subjective  81 y/o male with hx of recent UTI, Dementia admitted with nausea, vomiting and diarrhea. Last night was agitated and combative. This am appears calmer - but still confused Consultants:  GI- Dr. Ricki Rodriguez  Psychiatry   Objective: BP 140/76 mmHg  Pulse 56  Temp(Src) 98.3 F (36.8 C) (Oral)  Resp 18  Ht  (1.803 m)  Wt 65.545 kg (144 lb 8 oz)  BMI 20.16 kg/m2  SpO2 97%  Intake/Output Summary (Last 24 hours) at 09/13/15 1204 Last data filed at 09/13/15 1202  Gross per 24 hour  Intake    800 ml  Output    375 ml  Net    425 ml   Filed Weights   09/12/15 1337  Weight: 65.545 kg (144 lb 8 oz)    Exam:  General:Confused- Poor insight HEENT: PERRL; OP moist without lesions. Neck: supple, trachea midline, no thyromegaly Chest: normal to palpation Lungs: clear bilaterally without retractions or wheezes Cardiovascular: Irregular rate , no murmur Abdomen: soft, nontender, nondistended, positive bowel sounds Extremities: no clubbing, cyanosis, edema Neuro: Awake- Confused -moves all extremities Derm: Dressing noted on nose  Lymph: no cervical or supraclavicular lymphadenopathy   Labs and imaging studies were reviewed*  Data Reviewed: Basic Metabolic Panel:  Recent Labs Lab 09/09/15 1025 09/12/15 1340 09/13/15 0438  NA 140 138 143  K 4.4 4.2 3.7  CL 106 107 113*  CO2 GLUCOSE 105* 78 85  BUN 22* 20 18  CREATININE 1.19 1.08 0.95  CALCIUM 9.8 8.9 8.4*   Liver Function Tests:  Recent Labs Lab 09/09/15 1025 09/12/15 1340  AST 25 26  ALT 11* 12*  ALKPHOS 90 58  BILITOT 1.6* 1.0  PROT 7.8 6.3*  ALBUMIN 4.4 3.4*   No results for input(s): LIPASE, AMYLASE in the last 168 hours. No results for input(s): AMMONIA in the last 168 hours. CBC:  Recent Labs Lab 09/09/15 1025 09/12/15 1340 09/13/15 0438  WBC 9.9 4.9 4.1   NEUTROABS  --  3.2  --   HGB 13.8 11.7* 9.8*  HCT 43.6 37.5* 30.7*  MCV 86.7 87.3 86.0  PLT 147* 120* 111*   Cardiac Enzymes:    Recent Labs Lab 09/09/15 1025 09/12/15 1340 09/12/15 1914 09/13/15 0438  TROPONINI 0.05* 0.05* 0.05* 0.05*   BNP (last 3 results) No results for input(s): BNP in the last 8760 hours.  ProBNP (last 3 results) No results for input(s): PROBNP in the last 8760 hours.  CBG: No results for input(s): GLUCAP in the last 168 hours.  Recent Results (from the past 240 hour(s))  Urine culture     Status: None (Preliminary result)   Collection Time: 09/12/15  1:15 PM  Result Value Ref Range Status   Specimen Description URINE, CLEAN CATCH  Final   Special Requests Normal  Final   Culture NO GROWTH < 24 HOURS  Final   Report Status PENDING  Incomplete     Studies: X-ray Chest Pa And Lateral  09/12/2015   CLINICAL DATA:  Chest pain and shortness of breath, nausea and vomiting, atrial fibrillation, COPD, dementia, prior stroke  EXAM: CHEST  2 VIEW  COMPARISON:  09/09/2015  FINDINGS: Rotated to the LEFT.  Upper normal heart size.  Atherosclerotic calcification aorta.  Mediastinal contours and pulmonary vascularity normal.  Emphysematous changes with biapical scarring greater on LEFT.  Chronic interstitial prominence slightly greater  at lung bases, stable.  No acute infiltrate, pleural effusion or pneumothorax.  Dextro convex thoracic scoliosis.  Bones demineralized.  IMPRESSION: COPD changes with chronic interstitial disease and biapical scarring.  No acute abnormalities.   Electronically Signed   By: Ulyses Southward M.D.   On: 09/12/2015 14:18   Dg Abd 2 Views  09/12/2015   CLINICAL DATA:  Acute chest pain shortness of breath. Nausea and vomiting. History of atrial fibrillation, COPD  EXAM: ABDOMEN - 2 VIEW  COMPARISON:  12/13/2014  FINDINGS: scattered air and stool throughout the bowel. Small scattered air fluid levels on the upright exam. Negative for obstruction or  ileus. No free air. Monitor leads overlie the upper abdomen. Chronic basilar fibrotic changes noted. Degenerative changes of the spine with an associated scoliosis. Bones are osteopenic.  IMPRESSION: Nonobstructive bowel gas pattern.  Chronic findings as above.   Electronically Signed   By: Judie Petit.  Shick M.D.   On: 09/12/2015 14:19    Scheduled Meds: . cefTRIAXone (ROCEPHIN)  IV  1 g Intravenous Q24H  . clonazePAM  0.5 mg Oral Daily  . enoxaparin (LOVENOX) injection  40 mg Subcutaneous Q24H  . pantoprazole  40 mg Oral BID  . QUEtiapine  25 mg Oral QHS    Continuous Infusions: . sodium chloride 100 mL/hr at 09/13/15 1130    Assessment/Plan: 1 Altered mental status with confusion secondary to delirium and underlying vascular dementia On Seroquel and Clonazepam Received Ativan last night. Consult Psych 2 Nausea, vomiting and diarrhea; Appears to have resolved. Awaiting 3 Dysphagia; Await eval by speech, 4 Recent UTI- On IV Rocephin 5 Chronic a-fib/ elevated Troponin; Rate controlled. 6 Anemia; probably dilutional- Decrease IV rate to 50 ml/hr  D/ c lovenox 7 Weakness; Physical Therapy   Code Status: DNR  Family Communication: Son       09/13/2015, 12:04 PM  LOS: 1 day

## 2015-09-13 NOTE — Care Management Note (Signed)
Case Management Note  Patient Details  Name: Troy Gonzales MRN: [id number] Date of Birth: 17-Dec-1922  Subjective/Objective:   Admitted with n/v/d, worsening behavioral problems related to his dementia. Psych consult pending. Patient has Care [facility]. Attempted to reach son to clarify home environment and home health services. Will follow. CSW consult in, family looking at SNF.   Action/Plan:   Expected Discharge Date:                  Expected Discharge Plan:  Skilled Nursing Facility  In-House Referral:  Clinical Social Work  Discharge planning Services  CM Consult  Post Acute Care Choice:    Choice offered to:     DME Arranged:    DME Agency:     HH Arranged:    HH Agency:     Status of Service:  In process, will continue to follow  Medicare Important Message Given:    Date Medicare IM Given:    Medicare IM give by:    Date Additional Medicare IM Given:    Additional Medicare Important Message give by:     If discussed at Long Length of Stay Meetings, dates discussed:    Additional Comments:  Marily Memos, RN 09/13/2015, 1:19 PM

## 2015-09-13 NOTE — Consult Note (Signed)
WOC wound consult note Reason for Consult: Skin lesion removed by Dr Adriana Simas with [facility].  Family has been treating with vaseline gauze daily.  Donor site near left shoulder is resolving.  No treatment at this time, per family.  Wound type: Surgical excision to bridge of nose.  Pressure Ulcer POA: N/A Measurement:1 cm x 0.6 cm, fluctuant graft in place.  Yellow tissue noted to center.    Wound bed: graft site, 100% yellow  Fluctuant on the edges, adherent in the center.  Drainage (amount, consistency, odor) None noted today Periwound:Intact skin, no erythema, no induration Dressing procedure/placement/frequency:Cleanse lesion to nose with NS and gently pat dry.  Apply small piece vaseline gauze.  Cover with 2x2 gauze and secure with pink tape.  (This is a graft site, cleanse gently) Will not follow at this time.  Please re-consult if needed.  Maple Hudson RN BSN CWON Pager [phone]

## 2015-09-14 LAB — BASIC METABOLIC PANEL
ANION GAP: 6 (ref 5–15)
BUN: 13 mg/dL (ref 6–20)
CALCIUM: 8.8 mg/dL — AB (ref 8.9–10.3)
CO2: 24 mmol/L (ref 22–32)
Chloride: 115 mmol/L — ABNORMAL HIGH (ref 101–111)
Creatinine, Ser: 0.93 mg/dL (ref 0.61–1.24)
GFR calc non Af Amer: >60 mL/min (ref >60)
Glucose, Bld: 77 mg/dL (ref 65–99)
Potassium: 3.8 mmol/L (ref 3.5–5.1)
Sodium: 145 mmol/L (ref 135–145)

## 2015-09-14 LAB — URINE CULTURE
Culture: 1000
SPECIAL REQUESTS: NORMAL

## 2015-09-14 LAB — CBC WITH DIFFERENTIAL/PLATELET
BASOS ABS: 0 10*3/uL (ref 0–0.1)
BASOS PCT: 1 %
Eosinophils Absolute: 0.6 10*3/uL (ref 0–0.7)
Eosinophils Relative: 12 %
HEMATOCRIT: 33.6 % — AB (ref 40.0–52.0)
HEMOGLOBIN: 10.8 g/dL — AB (ref 13.0–18.0)
Lymphocytes Relative: 22 %
Lymphs Abs: 1.2 10*3/uL (ref 1.0–3.6)
MCH: 27.6 pg (ref 26.0–34.0)
MCHC: 32.2 g/dL (ref 32.0–36.0)
MCV: 85.7 fL (ref 80.0–100.0)
MONO ABS: 0.5 10*3/uL (ref 0.2–1.0)
Monocytes Relative: 9 %
NEUTROS ABS: 2.9 10*3/uL (ref 1.4–6.5)
NEUTROS PCT: 56 %
Platelets: 126 10*3/uL — ABNORMAL LOW (ref 150–440)
RBC: 3.92 MIL/uL — ABNORMAL LOW (ref 4.40–5.90)
RDW: 15.9 % — AB (ref 11.5–14.5)
WBC: 5.2 10*3/uL (ref 3.8–10.6)

## 2015-09-14 NOTE — Progress Notes (Signed)
PROGRESS NOTE  Troy Gonzales [id number] DOB: 1923/09/06 DOA: 09/12/2015 PCP: Barbette Reichmann, MD  Subjective  81 y/o male with hx of recent UTI, Dementia admitted with nausea, vomiting and diarrhea. Pt is less agitated- but still confused. States he rested last night.  Consultants:  GI- Dr. Ricki Rodriguez  Psychiatry- Dr. Toni Amend   Objective: BP 143/67 mmHg  Pulse 36  Temp(Src) 98.2 F (36.8 C) (Oral)  Resp 20  Ht  (1.803 m)  Wt 65.545 kg (144 lb 8 oz)  BMI 20.16 kg/m2  SpO2 91%  Intake/Output Summary (Last 24 hours) at 09/14/15 0820 Last data filed at 09/14/15 0507  Gross per 24 hour  Intake    400 ml  Output    900 ml  Net   -500 ml   Filed Weights   09/12/15 1337  Weight: 65.545 kg (144 lb 8 oz)    Exam:  General:Confused- Poor insight HEENT: PERRL; OP moist without lesions. Neck: supple, trachea midline, no thyromegaly Chest: normal to palpation Lungs: clear bilaterally without retractions or wheezes Cardiovascular: Irregular rate , no murmur Abdomen: soft, nontender, nondistended, positive bowel sounds Extremities: no clubbing, cyanosis, edema Neuro: Awake- Confused -moves all extremities Derm: Dressing noted on nose  Lymph: no cervical or supraclavicular lymphadenopathy   Labs and imaging studies were reviewed*  Data Reviewed: Basic Metabolic Panel:  Recent Labs Lab 09/09/15 1025 09/12/15 1340 09/13/15 0438 09/14/15 0409  NA 140 138 143 145  K 4.4 4.2 3.7 3.8  CL 106 107 113* 115*  CO2 GLUCOSE 105* 78 85 77  BUN 22* CREATININE 1.19 1.08 0.95 0.93  CALCIUM 9.8 8.9 8.4* 8.8*   Liver Function Tests:  Recent Labs Lab 09/09/15 1025 09/12/15 1340  AST 25 26  ALT 11* 12*  ALKPHOS 90 58  BILITOT 1.6* 1.0  PROT 7.8 6.3*  ALBUMIN 4.4 3.4*   No results for input(s): LIPASE, AMYLASE in the last 168 hours. No results for input(s): AMMONIA in the last 168 hours. CBC:  Recent Labs Lab 09/09/15 1025  09/12/15 1340 09/13/15 0438 09/14/15 0409  WBC 9.9 4.9 4.1 5.2  NEUTROABS  --  3.2  --  2.9  HGB 13.8 11.7* 9.8* 10.8*  HCT 43.6 37.5* 30.7* 33.6*  MCV 86.7 87.3 86.0 85.7  PLT 147* 120* 111* 126*   Cardiac Enzymes:    Recent Labs Lab 09/09/15 1025 09/12/15 1340 09/12/15 1914 09/13/15 0438  TROPONINI 0.05* 0.05* 0.05* 0.05*   BNP (last 3 results) No results for input(s): BNP in the last 8760 hours.  ProBNP (last 3 results) No results for input(s): PROBNP in the last 8760 hours.  CBG: No results for input(s): GLUCAP in the last 168 hours.  Recent Results (from the past 240 hour(s))  Urine culture     Status: None (Preliminary result)   Collection Time: 09/12/15  1:15 PM  Result Value Ref Range Status   Specimen Description URINE, CLEAN CATCH  Final   Special Requests Normal  Final   Culture NO GROWTH < 24 HOURS  Final   Report Status PENDING  Incomplete     Studies: No results found.  Scheduled Meds: . ALPRAZolam  0.25 mg Oral QHS  . cefTRIAXone (ROCEPHIN)  IV  1 g Intravenous Q24H  . clonazePAM  0.5 mg Oral Daily  . pantoprazole  40 mg Oral BID  . QUEtiapine  25 mg Oral TID    Continuous Infusions: . sodium chloride  50 mL/hr at 09/14/15 0400    Assessment/Plan: 1 Altered mental status with confusion secondary to delirium and underlying vascular dementia On Seroquel 25 mg po tid  and Clonazepam APPRECIATE HELP FROM pSYCH 2 Nausea, vomiting and diarrhea; Appears to have resolved. Awaiting 3 Dysphagia; Await Swallowing study 4 Recent UTI- On IV Rocephin 5 Chronic a-fib/ elevated Troponin; Rate controlled. 6 Anemia; probably dilutional- Hgb stable at 10.8  7 Weakness; Physical Therapy Family is interested in Placement   Code Status: DNR  Family Communication: Son       09/14/2015, 8:20 AM  LOS: 2 days

## 2015-09-14 NOTE — Evaluation (Signed)
Clinical/Bedside Swallow Evaluation Patient Details  Name: Troy Gonzales MRN: [id number] Date of Birth: March 23, 1923  Today's Date: 09/14/2015 Time: SLP Start Time (ACUTE ONLY): 0900 SLP Stop Time (ACUTE ONLY): 1000 SLP Time Calculation (min) (ACUTE ONLY): 60 min  Past Medical History:  Past Medical History  Diagnosis Date  . A-fib (HCC)   . COPD (chronic obstructive pulmonary disease) (HCC)   . Dementia   . Stroke [facility])    Past Surgical History: History reviewed. No pertinent past surgical history. HPI:  Troy Gonzales is a 81 y.o. male with a known history of Chronic a-fib, Dementia, Esophageal stricture, who has been C/o Nausea and vomiting associated with upper abdominal pain and loose stools x 3 days.   Assessment / Plan / Recommendation Clinical Impression  Pt has suspected esophageal dysphagia with known esophageal stricture per chart notes. GI is following Pt. Oral pharyngeal stage appeared adaquate with no immediate overt s/s of aspiration.  Pt burped/belched after he'd had enough ice cream and had 2 sips of water, he then throat cleared and coughed (which appeared related to belching/burping -- i.e., esophageal phase deficits).  Known esophageal phase deficits can increase risk for pharyngeal phase dysphagia and aspiration.  Recommend reflux precautions. ST will f/u as diet consistency is upgraded by GI. NSG updated.    Aspiration Risk  Mild (increased due to esophageal phase deficits)    Diet Recommendation Thin (Full liquid as rec. by GI due to esphageal deficits)   Medication Administration: Crushed with puree (as able) Compensations: Slow rate;Small sips/bites;Minimize environmental distractions    Other  Recommendations Recommended Consults: Consider GI evaluation (ongoing) Oral Care Recommendations: Oral care BID;Staff/trained caregiver to provide oral care   Follow Up Recommendations       Frequency and Duration min 2x/week  1 week   Pertinent Vitals/Pain None  reported    SLP Swallow Goals   See plan of care.   Swallow Study Prior Functional Status    Pt lived at home with son and has known history of esophageal stricture.    General Date of Onset: 09/12/15 Other Pertinent Information: Troy Gonzales is a 81 y.o. male with a known history of Chronic a-fib, Dementia, Esophageal stricture, who has been C/o Nausea and vomiting associated with upper abdominal pain and loose stools x 3 days. Type of Study: Bedside swallow evaluation Diet Prior to this Study: Thin liquids;Dysphagia 3 (soft) (known esophageal sphincter (per son/chart)) Temperature Spikes Noted: No Respiratory Status: Room air History of Recent Intubation: No Behavior/Cognition: Alert;Cooperative;Pleasant mood;Confused;Distractible;Requires cueing (known hx of dementia (per chart)) Oral Cavity - Dentition: Edentulous Self-Feeding Abilities: Able to feed self;Needs assist;Needs set up Patient Positioning: Upright in bed Baseline Vocal Quality: Low vocal intensity Volitional Cough: Strong Volitional Swallow: Able to elicit    Oral/Motor/Sensory Function Overall Oral Motor/Sensory Function: Appears within functional limits for tasks assessed Labial Symmetry: Within Functional Limits Facial Symmetry: Within Functional Limits Mandible: Within Functional Limits (as observed in bolus management)   Ice Chips Ice chips: Not tested   Thin Liquid Thin Liquid: Within functional limits Presentation: Cup Other Comments: Pt took ~5 sips of water by cup (on own) with no immediate overt s/s of aspiration noted    Nectar Thick Nectar Thick Liquid: Not tested   Honey Thick Honey Thick Liquid: Not tested   Puree Presentation: Self Fed;Spoon Other Comments: Pt fed self approx. 1/3 cup of vanilla ice cream with no immediate overt s/s of aspiration.  When he had enough, burping/belching noted and was  followed by coughing (and suspected phlemge as Pt asked to spit).   Solid   GO    Presentation:  Self Fed Oral Phase Impairments: Poor awareness of bolus (prolonged oral phase; possibly due to no dentures in) Pharyngeal Phase Impairments: Throat Clearing - Delayed (noted after trials, similar in that followed burping).       Coral Timme 09/14/2015,10:32 AM

## 2015-09-14 NOTE — Clinical Social Work Placement (Signed)
   CLINICAL SOCIAL WORK PLACEMENT  NOTE  Date:  09/14/2015  Patient Details  Name: Troy Gonzales MRN: [id number] Date of Birth: December 25, 1922  Clinical Social Work is seeking post-discharge placement for this patient at the   level of care (*CSW will initial, date and re-position this form in  chart as items are completed):      Patient/family provided with [facility] Clinical Social Work Department's list of facilities offering this level of care within the geographic area requested by the patient (or if unable, by the patient's family).      Patient/family informed of their freedom to choose among providers that offer the needed level of care, that participate in Medicare, Medicaid or managed care program needed by the patient, have an available bed and are willing to accept the patient.      Patient/family informed of [facility] ownership interest in [facility] and [facility], as well as of the fact that they are under no obligation to receive care at these facilities.  PASRR submitted to EDS on       PASRR number received on       Existing PASRR number confirmed on       FL2 transmitted to all facilities in geographic area requested by pt/family on       FL2 transmitted to all facilities within larger geographic area on       Patient informed that his/her managed care company has contracts with or will negotiate with certain facilities, including the following:            Patient/family informed of bed offers received.  Patient chooses bed at       Physician recommends and patient chooses bed at      Patient to be transferred to   on  .  Patient to be transferred to facility by       Patient family notified on   of transfer.  Name of family member notified:        PHYSICIAN       Additional Comment:    _______________________________________________ Soundra Pilon, LCSW 09/14/2015, 10:16 AM

## 2015-09-14 NOTE — Progress Notes (Signed)
Pt. Requested water he was given a few sips while sitting up. Pt. Experienced some SOB and coughing up mucus. His oxygen sats were at 92 i placed him on 2L of oxygen and his sats rose to 99. Pt. Is not complaining of SOB now and has went back to sleep. Will monitor.

## 2015-09-14 NOTE — Clinical Social Work Note (Signed)
Clinical Social Work Assessment  Patient Details  Name: Troy Gonzales MRN: [id number] Date of Birth: [birth year]  Date of referral:  09/14/15               Reason for consult:  Facility Placement                Permission sought to share information with:  Family Supports Permission granted to share information::     Name::      Shari Heritage Rickien  ([place]) [phone])  Agency::     Relationship::     Contact Information:     Housing/Transportation Living arrangements for the past 2 months:  Single Family Home Source of Information:  Power of [place], Adult Children Patient Interpreter Needed:  None Criminal Activity/Legal Involvement Pertinent to Current Situation/Hospitalization:  No - Comment as needed Significant Relationships:  Adult Children Lives with:    Do you feel safe going back to the place where you live?    Need for family participation in patient care:  Yes (Comment)  Care giving concerns:  None reported   Social Worker assessment / plan:  Patient is 81 year old male, has two son that rotate and live with patient.  Patient also has care [facility].   Patient spent time in [place] for SNF in 2015.  Patient evaluated by PT.  patient needs rehab to assist with his gait.   Family is in agreement with SNF for rehab.    Family also discussed concerns of wanting LTC for patient.  CSW discussed starting a Medicaid application to assist with LTC in the event they need placement after SNF.  Family states there are no resources for private pay.  CSW will complete FL2 and fax out in anticipation of patient discharging to SNF.   Employment status:  Retired Health and safety inspector:  [employer] PT Recommendations:  Skilled Nursing Facility Information / Referral to community resources:  Skilled Nursing Facility  Patient/Family's Response to care:  Family is in agreement with SNF  Patient/Family's Understanding of and Emotional Response to Diagnosis, Current Treatment, and  Prognosis:  Family understands patient will remain in the hospital for continue work up and discharged to SNF once medically clear by MD.  Emotional Assessment Appearance:  Appears stated age Attitude/Demeanor/Rapport:    Affect (typically observed):  Agitated, Appropriate, Pleasant Orientation:  Oriented to Self Alcohol / Substance use:  Never Used Psych involvement (Current and /or in the community):  No (Comment)  Discharge Needs  Concerns to be addressed:  Denies Needs/Concerns at this time Readmission within the last 30 days:  No Current discharge risk:  Chronically ill, Cognitively Impaired, Dependent with Mobility Barriers to Discharge:  No Barriers Identified   Soundra Pilon, LCSW 09/14/2015, 10:14 AM [name], MSW Clinical Social Work Department Emergency Room [phone] 10:15 AM

## 2015-09-14 NOTE — Consult Note (Addendum)
Subjective: Patient seen for nausea vomiting, diarrhea and dysphagia. He is been no repeat nausea vomiting or diarrhea since admission. he was much more cooperative today, allowing examination. He denies any nausea or abdominal pain.  Objective: Vital signs in last 24 hours: Temp:  [97.5 F (36.4 C)-98.2 F (36.8 C)] 98.2 F (36.8 C) (10/05 0504) Pulse Rate:  [36-62] 36 (10/05 0504) Resp:  [18-20] 20 (10/05 0504) BP: (143-152)/(67-76) 143/67 mmHg (10/05 0504) SpO2:  [91 %-99 %] 91 % (10/05 0504) Blood pressure 143/67, pulse 36, temperature 98.2 F (36.8 C), temperature source Oral, resp. rate 20, height  (1.803 m), weight 65.545 kg (144 lb 8 oz), SpO2 91 %.   Intake/Output from previous day: 10/04 0701 - 10/05 0700 In: 400 [I.V.:400] Out: 900 [Urine:900]  Intake/Output this shift: Total I/O In: 200 [I.V.:200] Out: -    General appearance:  81 year old male no acute distress Resp:  Clear to auscultation Cardio:  Regular rate and rhythm GI:  Soft mildly tender to palpation in the left abdomen. Bowel sounds are positive there are no masses or rebound. Extremities:  No clubbing cyanosis or edema   Lab Results: Results for orders placed or performed during the hospital encounter of 09/12/15 (from the past 24 hour(s))  CBC with Differential/Platelet     Status: Abnormal   Collection Time: 09/14/15  4:09 AM  Result Value Ref Range   WBC 5.2 3.8 - 10.6 K/uL   RBC 3.92 (L) 4.40 - 5.90 MIL/uL   Hemoglobin 10.8 (L) 13.0 - 18.0 g/dL   HCT 78.2 (L) 95.6 - 21.3 %   MCV 85.7 80.0 - 100.0 fL   MCH 27.6 26.0 - 34.0 pg   MCHC 32.2 32.0 - 36.0 g/dL   RDW 08.6 (H) 57.8 - 46.9 %   Platelets 126 (L) 150 - 440 K/uL   Neutrophils Relative % 56 %   Neutro Abs 2.9 1.4 - 6.5 K/uL   Lymphocytes Relative 22 %   Lymphs Abs 1.2 1.0 - 3.6 K/uL   Monocytes Relative 9 %   Monocytes Absolute 0.5 0.2 - 1.0 K/uL   Eosinophils Relative 12 %   Eosinophils Absolute 0.6 0 - 0.7 K/uL   Basophils  Relative 1 %   Basophils Absolute 0.0 0 - 0.1 K/uL  Basic metabolic panel     Status: Abnormal   Collection Time: 09/14/15  4:09 AM  Result Value Ref Range   Sodium 145 135 - 145 mmol/L   Potassium 3.8 3.5 - 5.1 mmol/L   Chloride 115 (H) 101 - 111 mmol/L   CO2 24 22 - 32 mmol/L   Glucose, Bld 77 65 - 99 mg/dL   BUN 13 6 - 20 mg/dL   Creatinine, Ser 6.29 0.61 - 1.24 mg/dL   Calcium 8.8 (L) 8.9 - 10.3 mg/dL   GFR calc non Af Amer >60 >60 mL/min   GFR calc Af Amer >60 >60 mL/min   Anion gap 6 5 - 15      Recent Labs  09/12/15 1340 09/13/15 0438 09/14/15 0409  WBC 4.9 4.1 5.2  HGB 11.7* 9.8* 10.8*  HCT 37.5* 30.7* 33.6*  PLT 120* 111* 126*   BMET  Recent Labs  09/12/15 1340 09/13/15 0438 09/14/15 0409  NA 138 143 145  K 4.2 3.7 3.8  CL 107 113* 115*  CO2 GLUCOSE 78 85 77  BUN CREATININE 1.08 0.95 0.93  CALCIUM 8.9 8.4* 8.8*  LFT  Recent Labs  09/12/15 1340  PROT 6.3*  ALBUMIN 3.4*  AST 26  ALT 12*  ALKPHOS 58  BILITOT 1.0   PT/INR No results for input(s): LABPROT, INR in the last 72 hours. Hepatitis Panel No results for input(s): HEPBSAG, HCVAB, HEPAIGM, HEPBIGM in the last 72 hours. C-Diff No results for input(s): CDIFFTOX in the last 72 hours. No results for input(s): CDIFFPCR in the last 72 hours.   Studies/Results: No results found.  Scheduled Inpatient Medications:   . ALPRAZolam  0.25 mg Oral QHS  . cefTRIAXone (ROCEPHIN)  IV  1 g Intravenous Q24H  . clonazePAM  0.5 mg Oral Daily  . pantoprazole  40 mg Oral BID  . QUEtiapine  25 mg Oral TID    Continuous Inpatient Infusions:   . sodium chloride 50 mL/hr at 09/14/15 1100    PRN Inpatient Medications:    Miscellaneous:   Assessment:  1 nausea vomiting diarrhea resolved 2. Dysphagia, multifactorial with esophagus dysmotility, distal narrowing  Plan:  1. Recommend barium swallow for further definition of the distal esophagus. 2. Agree with speech  pathology recommendations. 3. Discussed with Dr. Bettey Costa MD 09/14/2015, 2:37 PM

## 2015-09-15 ENCOUNTER — Inpatient Hospital Stay: Payer: Medicare Other

## 2015-09-15 LAB — CBC WITH DIFFERENTIAL/PLATELET
BASOS ABS: 0 10*3/uL (ref 0–0.1)
BASOS PCT: 1 %
EOS ABS: 0.4 10*3/uL (ref 0–0.7)
EOS PCT: 9 %
HCT: 31.5 % — ABNORMAL LOW (ref 40.0–52.0)
HEMOGLOBIN: 10.1 g/dL — AB (ref 13.0–18.0)
LYMPHS ABS: 1.1 10*3/uL (ref 1.0–3.6)
Lymphocytes Relative: 22 %
MCH: 27.4 pg (ref 26.0–34.0)
MCHC: 32.2 g/dL (ref 32.0–36.0)
MCV: 85.2 fL (ref 80.0–100.0)
Monocytes Absolute: 0.6 10*3/uL (ref 0.2–1.0)
Monocytes Relative: 12 %
NEUTROS PCT: 56 %
Neutro Abs: 2.7 10*3/uL (ref 1.4–6.5)
PLATELETS: 119 10*3/uL — AB (ref 150–440)
RBC: 3.7 MIL/uL — AB (ref 4.40–5.90)
RDW: 15.9 % — ABNORMAL HIGH (ref 11.5–14.5)
WBC: 4.8 10*3/uL (ref 3.8–10.6)

## 2015-09-15 LAB — BASIC METABOLIC PANEL
Anion gap: 7 (ref 5–15)
BUN: 10 mg/dL (ref 6–20)
CHLORIDE: 112 mmol/L — AB (ref 101–111)
CO2: 24 mmol/L (ref 22–32)
Calcium: 8.7 mg/dL — ABNORMAL LOW (ref 8.9–10.3)
Creatinine, Ser: 0.91 mg/dL (ref 0.61–1.24)
Glucose, Bld: 79 mg/dL (ref 65–99)
POTASSIUM: 3.4 mmol/L — AB (ref 3.5–5.1)
SODIUM: 143 mmol/L (ref 135–145)

## 2015-09-15 MED ORDER — QUETIAPINE FUMARATE 25 MG PO TABS: 25.0000 mg | ORAL_TABLET | Freq: Three times a day (TID) | ORAL | Status: AC

## 2015-09-15 MED ORDER — CLONAZEPAM 0.5 MG PO TABS
0.5000 mg | ORAL_TABLET | Freq: Every day | ORAL | Status: AC
Start: 2015-09-15 — End: ?

## 2015-09-15 MED ORDER — ZIPRASIDONE MESYLATE 20 MG IM SOLR
10.0000 mg | Freq: Once | INTRAMUSCULAR | Status: AC
  Administered 2015-09-15: 10 mg via INTRAMUSCULAR
  Filled 2015-09-15: qty 20

## 2015-09-15 NOTE — Consult Note (Signed)
  Psychiatry: Follow-up for this 81 year old man with dementia and agitation. Patient's agitation appears to be under much better control. It's reported that he is able to stay awake for much of the day and has done some physical therapy. His agitation is controlled to a degree that he can probably be safely discharge. Would not change any medicine at this point. Continue treatment as it is. No change to diagnosis.

## 2015-09-15 NOTE — Progress Notes (Signed)
PROGRESS NOTE  Troy Gonzales WUJ:[id number] DOB: 05/06/1923 DOA: 09/12/2015 PCP: Barbette Reichmann, MD  Subjective  81 y/o male with hx of recent UTI, Dementia admitted with nausea, vomiting and diarrhea. Pt is less agitated. Ba swallow- presby esophagus. No evidence for stricture    Consultants:  GI- Dr. Ricki Rodriguez  Psychiatry- Dr. Toni Amend   Objective: BP 137/74 mmHg  Pulse 81  Temp(Src) 97.6 F (36.4 C) (Oral)  Resp 16  Ht  (1.803 m)  Wt 65.545 kg (144 lb 8 oz)  BMI 20.16 kg/m2  SpO2 97%  Intake/Output Summary (Last 24 hours) at 09/15/15 1410 Last data filed at 09/15/15 0700  Gross per 24 hour  Intake    550 ml  Output    550 ml  Net      0 ml   Filed Weights   09/12/15 1337  Weight: 65.545 kg (144 lb 8 oz)    Exam:  General:Confused- Poor insight HEENT: PERRL; OP moist without lesions. Neck: supple, trachea midline, no thyromegaly Chest: normal to palpation Lungs: clear bilaterally without retractions or wheezes Cardiovascular: Irregular rate , no murmur Abdomen: soft, nontender, nondistended, positive bowel sounds Extremities: no clubbing, cyanosis, edema Neuro: Awake- Confused -moves all extremities Derm: Dressing noted on nose  Lymph: no cervical or supraclavicular lymphadenopathy   Labs and imaging studies were reviewed*  Data Reviewed: Basic Metabolic Panel:  Recent Labs Lab 09/09/15 1025 09/12/15 1340 09/13/15 0438 09/14/15 0409 09/15/15 0410  NA 140 138 143 145 143  K 4.4 4.2 3.7 3.8 3.4*  CL 106 107 113* 115* 112*  CO2 GLUCOSE 105* 78 85 77 79  BUN 22* CREATININE 1.19 1.08 0.95 0.93 0.91  CALCIUM 9.8 8.9 8.4* 8.8* 8.7*   Liver Function Tests:  Recent Labs Lab 09/09/15 1025 09/12/15 1340  AST 25 26  ALT 11* 12*  ALKPHOS 90 58  BILITOT 1.6* 1.0  PROT 7.8 6.3*  ALBUMIN 4.4 3.4*   No results for input(s): LIPASE, AMYLASE in the last 168 hours. No results for input(s): AMMONIA in the  last 168 hours. CBC:  Recent Labs Lab 09/09/15 1025 09/12/15 1340 09/13/15 0438 09/14/15 0409 09/15/15 0410  WBC 9.9 4.9 4.1 5.2 4.8  NEUTROABS  --  3.2  --  2.9 2.7  HGB 13.8 11.7* 9.8* 10.8* 10.1*  HCT 43.6 37.5* 30.7* 33.6* 31.5*  MCV 86.7 87.3 86.0 85.7 85.2  PLT 147* 120* 111* 126* 119*   Cardiac Enzymes:    Recent Labs Lab 09/09/15 1025 09/12/15 1340 09/12/15 1914 09/13/15 0438  TROPONINI 0.05* 0.05* 0.05* 0.05*   BNP (last 3 results) No results for input(s): BNP in the last 8760 hours.  ProBNP (last 3 results) No results for input(s): PROBNP in the last 8760 hours.  CBG: No results for input(s): GLUCAP in the last 168 hours.  Recent Results (from the past 240 hour(s))  Urine culture     Status: None   Collection Time: 09/12/15  1:15 PM  Result Value Ref Range Status   Specimen Description URINE, CLEAN CATCH  Final   Special Requests Normal  Final   Culture 1,000 COLONIES/mL INSIGNIFICANT GROWTH  Final   Report Status 09/14/2015 FINAL  Final     Studies: Dg Esophagus  09/15/2015   CLINICAL DATA:  Dysphagia, unable to swallow pills, limited history available  EXAM: ESOPHOGRAM/BARIUM SWALLOW  TECHNIQUE: Single contrast examination was performed using thin barium or water soluble.  FLUOROSCOPY TIME:  Fluoroscopy Time:  2 minutes, 12 seconds  Number of Acquired Images:  8  COMPARISON:  Chest x-ray of September 12, 2015  FINDINGS: The study is quite limited due to the patient's physical condition and limited ability to cooperate with positioning and drinking commands. The patient did ingest thin barium. Initiation of the swallowing maneuver peer delayed. There was no laryngeal penetration of the barium. The thoracic esophagus distended reasonably well. Prominent tertiary contractions were observed. In the supine position there was marked stasis of the barium within the esophagus. The GE junction was observed fluoroscopically and appeared to relax appropriately. The  patient was unable to mobilize the barium tablet in the oral cavity to propel it into the esophagus. The movements of the tongue did not appear purposeful. The tablet gradually dissolved in the oral cavity.  IMPRESSION: 1. This is a very limited study. There are changes of presbyesophagus without evidence of a fixed stricture. No reflux was observed. There is stasis of the barium when the patient is in the supine position. 2. The patient was unable to mobilize the barium tablet in the mouth to propel it into the esophagus.   Electronically Signed   By: David  [place] M.D.   On: 09/15/2015 08:59    Scheduled Meds: . ALPRAZolam  0.25 mg Oral QHS  . cefTRIAXone (ROCEPHIN)  IV  1 g Intravenous Q24H  . clonazePAM  0.5 mg Oral Daily  . pantoprazole  40 mg Oral BID  . QUEtiapine  25 mg Oral TID    Continuous Infusions: . sodium chloride 50 mL/hr at 09/14/15 2207    Assessment/Plan: 1 Altered mental status with confusion secondary to delirium and underlying vascular dementia On Seroquel 25 mg po tid  and Clonazepam 2 Nausea, vomiting and diarrhea; Appears to have resolved. Awaiting 3 Dysphagia; Ba swallow- Presbyesophagus- Will need strict aspiration preacautions 4 Recent  UTI- On IV Rocephin 5 Chronic a-fib/ elevated Troponin; Rate controlled. 6 Anemia; probably dilutional- Hgb stable at 10.1 7 Hypokalemia; Replace  8 Weakness; Physical Therapy D/c sitter. If stable can go to rehab in am    Code Status: DNR  Family Communication: Sons       09/15/2015, 2:10 PM  LOS: 3 days

## 2015-09-15 NOTE — Care Management Note (Signed)
Case Management Note  Patient Details  Name: Troy Gonzales MRN: [id number] Date of Birth: 1923-06-29  Subjective/Objective:   Spoke with son, Troy Gonzales briefly at bedside. Updated on search for SNF bed. He is not interested in [facility]. Spoke with Dr. Marcello Fennel, he states patient is ready once a bed is found.                   Action/Plan: SNF  Expected Discharge Date:                  Expected Discharge Plan:  Skilled Nursing Facility  In-House Referral:  Clinical Social Work  Discharge planning Services  CM Consult  Post Acute Care Choice:    Choice offered to:     DME Arranged:    DME Agency:     HH Arranged:    HH Agency:     Status of Service:  In process, will continue to follow  Medicare Important Message Given:    Date Medicare IM Given:    Medicare IM give by:    Date Additional Medicare IM Given:    Additional Medicare Important Message give by:     If discussed at Long Length of Stay Meetings, dates discussed:    Additional Comments:  Marily Memos, RN 09/15/2015, 8:18 AM

## 2015-09-15 NOTE — Progress Notes (Signed)
Patient has a nurse aide at bedside overnight. He was impulsive at the beginning of the shift, but was able to rest for most of the night. Patient has no acute event he remained asymptomatic afib  on the monitor  With VS WDL for patient. He was assisted with bathroom as needed. Patient currently has a c-diff stool sample pending for collection.

## 2015-09-15 NOTE — Progress Notes (Signed)
Patient oriented to self.  Not impulsive during dayshift 7-3.  Worked with PT and sat in chair for awhile.  Sitter was discharged.  Family at bedside.  VSS, no complaints of pain.

## 2015-09-15 NOTE — Progress Notes (Signed)
Physical Therapy Treatment Patient Details Name: Troy Gonzales MRN: [id number] DOB: 01-07-1923 Today's Date: 09/15/2015    History of Present Illness Troy Gonzales is a 81 y.o. male who presents today with nausea and weakness. He feels rundown and has low energy. He also admits to some chest pain but reports he always has chest pain. He describes a burning in the center of his chest as mild. His sons report that the physical therapist checked his blood pressure and it was low when he was feeling lightheaded.    PT Comments    Pt still appears to be mildly confused today, although did respond to commands/questions more appropriately than yesterday. Pt was able to perform bed mobility with mod independence when coming from supine<>sit, requiring more time and bed rails for assistance. Pt sit<>stand transfer and ambulation was performed with hand held assist +1. Pt is mildly unsteady with all functional mobility and due to his current level of confusion he is at an increased risk for falls. Pt also frequently staggers to his R when ambulating which requires min assist from PT to correct. Pt requires constant supervision with all functional mobility in order to minimize fall risk. Current d/c recommendations remain appropriate.   Follow Up Recommendations  SNF     Equipment Recommendations  Rolling walker with 5" wheels (Pt needs superivision with all gait and someone to make sure he is using walker appropriately. )    Recommendations for Other Services       Precautions / Restrictions Precautions Precautions: Fall Precaution Comments: Pt still confused, but less than yesterday. Pt still a fall risk.  Restrictions Weight Bearing Restrictions: No    Mobility  Bed Mobility Overal bed mobility: Modified Independent Bed Mobility: Supine to Sit     Supine to sit: Modified independent (Device/Increase time)     General bed mobility comments: pt used bed rail for transfer  supine<>sit  Transfers Overall transfer level: Needs assistance Equipment used: 1 person hand held assist Transfers: Sit to/from Stand Sit to Stand:  (+1 hand held assist, otherwise independent)            Ambulation/Gait Ambulation/Gait assistance: Min assist Ambulation Distance (Feet): 300 Feet Assistive device: 1 person hand held assist Gait Pattern/deviations: Staggering right;Step-through pattern;Trunk flexed   Gait velocity interpretation: <1.8 ft/sec, indicative of risk for recurrent falls General Gait Details: Pt would occasionally grab at the wall with his R arm in order to help with his balance, even though the wall would be several feet away from him. Pt steady with his gait with hand held assist but given the apparent lack of depth perception/udgement, pt is at increased risk of falls if he would attempt independent ambulation,   Stairs            Wheelchair Mobility    Modified Rankin (Stroke Patients Only)       Balance Overall balance assessment: Needs assistance Sitting-balance support: No upper extremity supported;Feet supported Sitting balance-Leahy Scale: Fair       Standing balance-Leahy Scale: Poor Standing balance comment: see gait analysis                    Cognition Arousal/Alertness: Awake/alert Behavior During Therapy: WFL for tasks assessed/performed (mildly confused at times) Overall Cognitive Status: Difficult to assess Area of Impairment: Awareness   Current Attention Level: Divided (pt became easily distracted with environment when walking hallway)     Safety/Judgement: Decreased awareness of safety   Problem Solving:  Slow processing      Exercises      General Comments        Pertinent Vitals/Pain Pain Assessment:  (pt did not rate pain)    Home Living                      Prior Function            PT Goals (current goals can now be found in the care plan section) Acute Rehab PT  Goals Patient Stated Goal: to go home PT Goal Formulation: With patient Time For Goal Achievement: 09/26/15 Potential to Achieve Goals: Fair Progress towards PT goals: Progressing toward goals    Frequency  Min 2X/week    PT Plan Current plan remains appropriate    Co-evaluation             End of Session Equipment Utilized During Treatment: Gait belt Activity Tolerance: Patient tolerated treatment well Patient left: in chair;with nursing/sitter in room;with call bell/phone within reach     Time: 1110-1133 PT Time Calculation (min) (ACUTE ONLY): 23 min  Charges:                       G CodesGeorgina Peer 09/15/2015, 12:48 PM

## 2015-09-15 NOTE — Progress Notes (Addendum)
Speech Language Pathology Treatment: Dysphagia  Patient Details Name: Troy Gonzales MRN: [id number] DOB: [birth year] Today's Date: 09/15/2015 Time: 4098-1191 SLP Time Calculation (min) (ACUTE ONLY): 39 min  Assessment / Plan / Recommendation Clinical Impression  Pt appears to be tolerating current diet of Full Liquid (as per GI) w/ no overt s/s of aspiration noted by staff and family. Pt is following general aspiration precautions including small sips/bites and eating/drinking slowly; this also aids Esophageal motility as pt does have dysmotility and possible narrowing at the distal Esophagus per GI. Rec. continue w/ general aspiration and Reflux precautions during oral intake; ST will f/u as pt's diet consistency is upgraded to solids by GI. Rec. Continue w/ Meds in Puree - crushed as able for easier swallowing sec. To the confusion and Cognitive decline. NSG and CM updated.    HPI Other Pertinent Information: Troy Gonzales is a 81 y.o. male with a known history of Chronic a-fib, Dementia, Esophageal stricture, who has been C/o Nausea and vomiting associated with upper abdominal pain and loose stools x 3 days. Pt remains on a Full Liquid diet per GI who is following; pt has known Esophageal dysmotility. Pt has been tolerating current diet, thin liquids, w/ no overt s/s of aspiration noted by staff and Son who was present today.    Pertinent Vitals Pain Assessment: No/denies pain  SLP Plan  Continue with current plan of care (next 2-3 days as nec.; moreso when diet upgraded)    Recommendations Diet recommendations: Thin liquid (Full Liquid diet) Liquids provided via: Cup;Straw Medication Administration: Whole meds with puree Supervision: Patient able to self feed;Full supervision/cueing for compensatory strategies (setup A) Compensations: Slow rate;Small sips/bites;Minimize environmental distractions Postural Changes and/or Swallow Maneuvers: Seated upright 90 degrees (Reflux precautions)              General recommendations:  (Dietician f/u) Oral Care Recommendations: Oral care BID;Staff/trained caregiver to provide oral care Follow up Recommendations:  (TBD for ST services) Plan: Continue with current plan of care (next 2-3 days as [place].; moreso when diet upgraded)    GO    Jerilynn Som, MS, CCC-SLP  Watson,Katherine 09/15/2015, 3:13 PM

## 2015-09-16 LAB — BASIC METABOLIC PANEL
Anion gap: 10 (ref 5–15)
BUN: 11 mg/dL (ref 6–20)
CALCIUM: 9.2 mg/dL (ref 8.9–10.3)
CO2: 21 mmol/L — AB (ref 22–32)
CREATININE: 1.02 mg/dL (ref 0.61–1.24)
Chloride: 113 mmol/L — ABNORMAL HIGH (ref 101–111)
GFR calc non Af Amer: >60 mL/min (ref >60)
Glucose, Bld: 105 mg/dL — ABNORMAL HIGH (ref 65–99)
Potassium: 3.4 mmol/L — ABNORMAL LOW (ref 3.5–5.1)
SODIUM: 144 mmol/L (ref 135–145)

## 2015-09-16 LAB — CBC WITH DIFFERENTIAL/PLATELET
BASOS PCT: 1 %
Basophils Absolute: 0 10*3/uL (ref 0–0.1)
EOS ABS: 0.3 10*3/uL (ref 0–0.7)
Eosinophils Relative: 5 %
HEMATOCRIT: 33.8 % — AB (ref 40.0–52.0)
Hemoglobin: 10.8 g/dL — ABNORMAL LOW (ref 13.0–18.0)
Lymphocytes Relative: 20 %
Lymphs Abs: 1.2 10*3/uL (ref 1.0–3.6)
MCH: 27 pg (ref 26.0–34.0)
MCHC: 31.9 g/dL — AB (ref 32.0–36.0)
MCV: 84.6 fL (ref 80.0–100.0)
MONO ABS: 0.6 10*3/uL (ref 0.2–1.0)
MONOS PCT: 10 %
Neutro Abs: 3.9 10*3/uL (ref 1.4–6.5)
Neutrophils Relative %: 64 %
Platelets: 143 10*3/uL — ABNORMAL LOW (ref 150–440)
RBC: 4 MIL/uL — ABNORMAL LOW (ref 4.40–5.90)
RDW: 15.9 % — AB (ref 11.5–14.5)
WBC: 6 10*3/uL (ref 3.8–10.6)

## 2015-09-16 NOTE — Progress Notes (Signed)
Pt discharged to [facility] via wc with son.  Instructions and rx given to family.  Questions answered.  No distress.

## 2015-09-16 NOTE — Care Management Note (Signed)
Case Management Note  Patient Details  Name: Troy Gonzales MRN: [id number] Date of Birth: 1923/04/05  Subjective/Objective:  Notified [facility] that patient was discharging to [facility] today                  Action/Plan:   Expected Discharge Date:                  Expected Discharge Plan:  Skilled Nursing Facility  In-House Referral:  Clinical Social Work  Discharge planning Services  CM Consult  Post Acute Care Choice:    Choice offered to:     DME Arranged:    DME Agency:     HH Arranged:    HH Agency:     Status of Service:  In process, will continue to follow  Medicare Important Message Given:  Yes-fourth notification given Date Medicare IM Given:    Medicare IM give by:    Date Additional Medicare IM Given:    Additional Medicare Important Message give by:     If discussed at Long Length of Stay Meetings, dates discussed:    Additional Comments:  Marily Memos, RN 09/16/2015, 2:05 PM

## 2015-09-16 NOTE — Clinical Social Work Placement (Signed)
   CLINICAL SOCIAL WORK PLACEMENT  NOTE  Date:  09/16/2015  Patient Details  Name: Troy Gonzales MRN: [id number] Date of Birth: 17-Nov-1923  Clinical Social Work is seeking post-discharge placement for this patient at the Skilled  Nursing Facility level of care (*CSW will initial, date and re-position this form in  chart as items are completed):  No   Patient/family provided with [facility] Clinical Social Work Department's list of facilities offering this level of care within the geographic area requested by the patient (or if unable, by the patient's family).  Yes   Patient/family informed of their freedom to choose among providers that offer the needed level of care, that participate in Medicare, Medicaid or managed care program needed by the patient, have an available bed and are willing to accept the patient.  Yes   Patient/family informed of [facility]'s ownership interest in [facility] and [facility], as well as of the fact that they are under no obligation to receive care at these facilities.  PASRR submitted to EDS on       PASRR number received on       Existing PASRR number confirmed on 09/14/15     FL2 transmitted to all facilities in geographic area requested by pt/family on 09/14/15     FL2 transmitted to all facilities within larger geographic area on       Patient informed that his/her managed care company has contracts with or will negotiate with certain facilities, including the following:        Yes (Patient's son's will review information and decide on a facility.  CSW will follow up in the AM)   Patient/family informed of bed offers received.  Patient chooses bed at  [facility])     Physician recommends and patient chooses bed at      Patient to be transferred to  [facility]) on  .  Patient to be transferred to facility by  (son's Rickie and Marcial Pacas)     Patient family notified on 09/16/15 of transfer.  Name of family member  notified:   (Rickie and [place])     PHYSICIAN Please sign FL2, Please sign DNR     Additional Comment:    _______________________________________________ Soundra Pilon, LCSW 09/16/2015, 2:31 PM [name], MSW Clinical Social Work Department Emergency Room [phone] 2:31 PM

## 2015-09-16 NOTE — Progress Notes (Signed)
CSW informed by MD that patient is medically ready to discharge to [facility] for STR.  Patient and family are in agreement with discharge.  Patient will go to Rm 113-B and call report to Burna Mortimer [phone]  CSW updated FL2 and fax Rx's to facility.  RN called report and family will transport.  Discharge packet complete.  [name], MSW Clinical Social Work Department Emergency Room [phone] 2:34 PM

## 2015-09-16 NOTE — Progress Notes (Signed)
PROGRESS NOTE  Troy Gonzales ZOX:[id number] DOB: 1923-05-23 DOA: 09/12/2015 PCP: Barbette Reichmann, MD  Subjective  81 y/o male with hx of recent UTI, Dementia admitted with nausea, vomiting and diarrhea. Pt is feeling better Ba swallow- presby esophagus. No evidence for stricture Tolerating pureed diet    Consultants:  GI- Dr. Ricki Rodriguez  Psychiatry- Dr. Toni Amend   Objective: BP 153/86 mmHg  Pulse 87  Temp(Src) 98.3 F (36.8 C) (Oral)  Resp 17  Ht  (1.803 m)  Wt 65.545 kg (144 lb 8 oz)  BMI 20.16 kg/m2  SpO2 97%  Intake/Output Summary (Last 24 hours) at 09/16/15 0811 Last data filed at 09/16/15 0452  Gross per 24 hour  Intake    300 ml  Output    300 ml  Net      0 ml   Filed Weights   09/12/15 1337  Weight: 65.545 kg (144 lb 8 oz)    Exam:  General:Not in distress Poor insight HEENT: PERRL; OP moist without lesions. Neck: supple, trachea midline, no thyromegaly Chest: normal to palpation Lungs: clear bilaterally without retractions or wheezes Cardiovascular: Irregular rate , no murmur Abdomen: soft, nontender, nondistended, positive bowel sounds Extremities: no clubbing, cyanosis, edema Neuro: Awake- Confused -moves all extremities Derm: Dressing noted on nose  Lymph: no cervical or supraclavicular lymphadenopathy   Labs and imaging studies were reviewed*  Data Reviewed: Basic Metabolic Panel:  Recent Labs Lab 09/12/15 1340 09/13/15 0438 09/14/15 0409 09/15/15 0410 09/16/15 0349  NA 138 143 145 143 144  K 4.2 3.7 3.8 3.4* 3.4*  CL 107 113* 115* 112* 113*  CO2 21*  GLUCOSE 78 85 77 79 105*  BUN CREATININE 1.08 0.95 0.93 0.91 1.02  CALCIUM 8.9 8.4* 8.8* 8.7* 9.2   Liver Function Tests:  Recent Labs Lab 09/09/15 1025 09/12/15 1340  AST 25 26  ALT 11* 12*  ALKPHOS 90 58  BILITOT 1.6* 1.0  PROT 7.8 6.3*  ALBUMIN 4.4 3.4*   No results for input(s): LIPASE, AMYLASE in the last 168 hours. No results  for input(s): AMMONIA in the last 168 hours. CBC:  Recent Labs Lab 09/12/15 1340 09/13/15 0438 09/14/15 0409 09/15/15 0410 09/16/15 0349  WBC 4.9 4.1 5.2 4.8 6.0  NEUTROABS 3.2  --  2.9 2.7 3.9  HGB 11.7* 9.8* 10.8* 10.1* 10.8*  HCT 37.5* 30.7* 33.6* 31.5* 33.8*  MCV 87.3 86.0 85.7 85.2 84.6  PLT 120* 111* 126* 119* 143*   Cardiac Enzymes:    Recent Labs Lab 09/09/15 1025 09/12/15 1340 09/12/15 1914 09/13/15 0438  TROPONINI 0.05* 0.05* 0.05* 0.05*   BNP (last 3 results) No results for input(s): BNP in the last 8760 hours.  ProBNP (last 3 results) No results for input(s): PROBNP in the last 8760 hours.  CBG: No results for input(s): GLUCAP in the last 168 hours.  Recent Results (from the past 240 hour(s))  Urine culture     Status: None   Collection Time: 09/12/15  1:15 PM  Result Value Ref Range Status   Specimen Description URINE, CLEAN CATCH  Final   Special Requests Normal  Final   Culture 1,000 COLONIES/mL INSIGNIFICANT GROWTH  Final   Report Status 09/14/2015 FINAL  Final     Studies: Dg Esophagus  09/15/2015   CLINICAL DATA:  Dysphagia, unable to swallow pills, limited history available  EXAM: ESOPHOGRAM/BARIUM SWALLOW  TECHNIQUE: Single contrast examination was performed using thin barium or  water soluble.  FLUOROSCOPY TIME:  Fluoroscopy Time:  2 minutes, 12 seconds  Number of Acquired Images:  8  COMPARISON:  Chest x-ray of September 12, 2015  FINDINGS: The study is quite limited due to the patient's physical condition and limited ability to cooperate with positioning and drinking commands. The patient did ingest thin barium. Initiation of the swallowing maneuver peer delayed. There was no laryngeal penetration of the barium. The thoracic esophagus distended reasonably well. Prominent tertiary contractions were observed. In the supine position there was marked stasis of the barium within the esophagus. The GE junction was observed fluoroscopically and appeared to  relax appropriately. The patient was unable to mobilize the barium tablet in the oral cavity to propel it into the esophagus. The movements of the tongue did not appear purposeful. The tablet gradually dissolved in the oral cavity.  IMPRESSION: 1. This is a very limited study. There are changes of presbyesophagus without evidence of a fixed stricture. No reflux was observed. There is stasis of the barium when the patient is in the supine position. 2. The patient was unable to mobilize the barium tablet in the mouth to propel it into the esophagus.   Electronically Signed   By: David  [place] M.D.   On: 09/15/2015 08:59    Scheduled Meds: . ALPRAZolam  0.25 mg Oral QHS  . cefTRIAXone (ROCEPHIN)  IV  1 g Intravenous Q24H  . clonazePAM  0.5 mg Oral Daily  . pantoprazole  40 mg Oral BID  . QUEtiapine  25 mg Oral TID    Continuous Infusions: . sodium chloride Stopped (09/16/15 0000)    Assessment/Plan: 1 Altered mental status with confusion secondary to delirium and underlying vascular dementia On Seroquel 25 mg po tid  and Clonazepam 2 Nausea, vomiting and diarrhea; Appears to have resolved. 3 Dysphagia; Ba swallow- Presbyesophagus- Will need strict aspiration preacautions 4 Recent  UTI- D/C Rocephiin 5 Chronic a-fib/ elevated Troponin; Rate controlled. 6 Anemia; probably dilutional- Hgb stable at 10.1 7 Hypokalemia; Replace  8 Weakness; Physical Therapy Discharge to rehab today  Code Status: DNR  Family Communication: Sons       09/16/2015, 8:11 AM  LOS: 4 days

## 2015-09-16 NOTE — Care Management Important Message (Signed)
Important Message  Patient Details  Name: Troy Gonzales MRN: [id number] Date of Birth: 02-06-1923   Medicare Important Message Given:  Yes-fourth notification given    Marily Memos, RN 09/16/2015, 11:57 AM

## 2015-09-16 NOTE — Progress Notes (Signed)
Report called to Lonzo Cloud RN at [facility]

## 2015-09-16 NOTE — Discharge Summary (Signed)
Physician Discharge Summary  Troy Gonzales ZOX:[id number] DOB: 1922/12/25 DOA: 09/12/2015  PCP: Barbette Reichmann, MD  Admit date: 09/12/2015 Discharge date: 09/16/2015  Time spent:  minutes  Recommendations for Outpatient Follow-up:  1 D/c to Skilled Nursing today Call office to make appt in in 2-3  weeks    Discharge Diagnoses:  1 Nausea, vomiting and diarrhea- Resolved  2 Recent UTI 3  Weakness 4 Dementia with behavioral disturbance 5 Dysphagia with presbyesophagus  6 Chronic A fib 7 Anemia   Discharge Condition: Stable  Diet recommendation:   Filed Weights   09/12/15 1337  Weight: 65.545 kg (144 lb 8 oz)    History of present illness:  Troy Gonzales is a 81 year old male with a history of chronic A. fib dementia esophageal stricture, who was admitted from the clinic after her family complained that he had been having intractable nausea vomiting associated with upper abdominal pain and loose stool Patient had been seen in the ED a few days prior and was noted have a UTI and had been treated with Cipro. Family was also concerned about worsening mental status with increased aggression. Patient was also hypotensive on admission with systolic blood pressure in the high 80s  Hospital Course:  Patient was admitted [facility] and received intravenous fluids IV antibiotics with Rocephin. He was seen in consultation by Dr. Paulla Fore gastroenterologist and also Dr. Toni Amend psychiatrist he was evaluated by speech pathologist and underwent a barium swallow study which showed changes of presbyesophagus without evidence of fixed stricture no reflux was observed. Patient is unable to mobilize the barium tablet in the multiple pellet into the esophagus. There was also stasis of barium with the patient was in the supine position. Patient's mental status improved with the surgical and Xanax and clonazepam. He was continued on by mouth Protonix Family was interested in  placing him in a nursing home he was transferred in stable condition to skilled nursing facility. Pt was  advised strict aspiration precautions and  making sure the patient is sitting upright during mealtimes Patient was discharged in stable condition and advised to follow me Dr. Marcello Fennel  in 2-3 weeks.   Procedures: .Barium Swallow  Consultations: Dr. Abran Cantor Dr. Toni Amend- Psychiatrist  Discharge Exam: Filed Vitals:   09/16/15 0741  BP: 153/86  Pulse: 87  Temp: 98.3 F (36.8 C)  Resp: 17    General: Dishevelled Cardiovascular: S1 S2 Respiratory: Clear to auscultaion  Discharge Instructions    Current Discharge Medication List    START taking these medications   Details  QUEtiapine (SEROQUEL) 25 MG tablet Take 1 tablet (25 mg total) by mouth 3 (three) times daily. Qty: 90 tablet, Refills: 3      CONTINUE these medications which have CHANGED   Details  clonazePAM (KLONOPIN) 0.5 MG tablet Take 1 tablet (0.5 mg total) by mouth daily. Qty: 30 tablet, Refills: 0      CONTINUE these medications which have NOT CHANGED   Details  albuterol (PROVENTIL HFA;VENTOLIN HFA) 108 (90 BASE) MCG/ACT inhaler Inhale 2 puffs into the lungs every 4 (four) hours as needed. For shortness of breath    ALPRAZolam (XANAX) 0.25 MG tablet Take 0.25 mg by mouth at bedtime as needed for anxiety. Half at tablet at 5 (right before supper) and other half at 8:30    aspirin EC 81 MG tablet Take 81 mg by mouth daily.    Cholecalciferol (VITAMIN D3) 5000 UNITS TABS Take 2 tablets by mouth every Saturday.  docusate sodium (COLACE) 100 MG capsule Take 100-200 mg by mouth 2 (two) times daily.    pantoprazole (PROTONIX) 40 MG tablet Take 40 mg by mouth 2 (two) times daily.    sennosides-docusate sodium (SENOKOT-S) 8.6-50 MG tablet Take 1 tablet by mouth daily.    vitamin B-12 (CYANOCOBALAMIN) 500 MCG tablet Take 500 mcg by mouth 2 (two) times daily.      STOP taking these medications      ciprofloxacin (CIPRO) 500 MG tablet      Melatonin 5 MG TABS        Allergies  Allergen Reactions  . Codeine   . Haldol [Haloperidol Lactate]   . Penicillins       The results of significant diagnostics from this hospitalization (including imaging, microbiology, ancillary and laboratory) are listed below for reference.    Significant Diagnostic Studies: X-ray Chest Pa And Lateral  09/12/2015   CLINICAL DATA:  Chest pain and shortness of breath, nausea and vomiting, atrial fibrillation, COPD, dementia, prior stroke  EXAM: CHEST  2 VIEW  COMPARISON:  09/09/2015  FINDINGS: Rotated to the LEFT.  Upper normal heart size.  Atherosclerotic calcification aorta.  Mediastinal contours and pulmonary vascularity normal.  Emphysematous changes with biapical scarring greater on LEFT.  Chronic interstitial prominence slightly greater at lung bases, stable.  No acute infiltrate, pleural effusion or pneumothorax.  Dextro convex thoracic scoliosis.  Bones demineralized.  IMPRESSION: COPD changes with chronic interstitial disease and biapical scarring.  No acute abnormalities.   Electronically Signed   By: Ulyses Southward M.D.   On: 09/12/2015 14:18   Dg Esophagus  09/15/2015   CLINICAL DATA:  Dysphagia, unable to swallow pills, limited history available  EXAM: ESOPHOGRAM/BARIUM SWALLOW  TECHNIQUE: Single contrast examination was performed using thin barium or water soluble.  FLUOROSCOPY TIME:  Fluoroscopy Time:  2 minutes, 12 seconds  Number of Acquired Images:  8  COMPARISON:  Chest x-ray of September 12, 2015  FINDINGS: The study is quite limited due to the patient's physical condition and limited ability to cooperate with positioning and drinking commands. The patient did ingest thin barium. Initiation of the swallowing maneuver peer delayed. There was no laryngeal penetration of the barium. The thoracic esophagus distended reasonably well. Prominent tertiary contractions were observed. In the supine position there  was marked stasis of the barium within the esophagus. The GE junction was observed fluoroscopically and appeared to relax appropriately. The patient was unable to mobilize the barium tablet in the oral cavity to propel it into the esophagus. The movements of the tongue did not appear purposeful. The tablet gradually dissolved in the oral cavity.  IMPRESSION: 1. This is a very limited study. There are changes of presbyesophagus without evidence of a fixed stricture. No reflux was observed. There is stasis of the barium when the patient is in the supine position. 2. The patient was unable to mobilize the barium tablet in the mouth to propel it into the esophagus.   Electronically Signed   By: David  [place] M.D.   On: 09/15/2015 08:59   Dg Chest Portable 1 View  09/09/2015   CLINICAL DATA:  Chest pain, nausea, vomiting since last night  EXAM: PORTABLE CHEST 1 VIEW  COMPARISON:  CT chest 07/07/2014  FINDINGS: The lungs are hyperinflated likely secondary to COPD. There is chronic elevation of the left diaphragm. There is chronic right upper lobe parenchymal scarring. There is no pleural effusion or pneumothorax. The heart and mediastinal contours are unremarkable.  The osseous structures are unremarkable.  IMPRESSION: No active disease.   Electronically Signed   By: Elige Ko   On: 09/09/2015 10:32   Dg Abd 2 Views  09/12/2015   CLINICAL DATA:  Acute chest pain shortness of breath. Nausea and vomiting. History of atrial fibrillation, COPD  EXAM: ABDOMEN - 2 VIEW  COMPARISON:  12/13/2014  FINDINGS: scattered air and stool throughout the bowel. Small scattered air fluid levels on the upright exam. Negative for obstruction or ileus. No free air. Monitor leads overlie the upper abdomen. Chronic basilar fibrotic changes noted. Degenerative changes of the spine with an associated scoliosis. Bones are osteopenic.  IMPRESSION: Nonobstructive bowel gas pattern.  Chronic findings as above.   Electronically Signed   By: Judie Petit.   Shick M.D.   On: 09/12/2015 14:19    Microbiology: Recent Results (from the past 240 hour(s))  Urine culture     Status: None   Collection Time: 09/12/15  1:15 PM  Result Value Ref Range Status   Specimen Description URINE, CLEAN CATCH  Final   Special Requests Normal  Final   Culture 1,000 COLONIES/mL INSIGNIFICANT GROWTH  Final   Report Status 09/14/2015 FINAL  Final     Labs: Basic Metabolic Panel:  Recent Labs Lab 09/12/15 1340 09/13/15 0438 09/14/15 0409 09/15/15 0410 09/16/15 0349  NA 138 143 145 143 144  K 4.2 3.7 3.8 3.4* 3.4*  CL 107 113* 115* 112* 113*  CO2 21*  GLUCOSE 78 85 77 79 105*  BUN CREATININE 1.08 0.95 0.93 0.91 1.02  CALCIUM 8.9 8.4* 8.8* 8.7* 9.2   Liver Function Tests:  Recent Labs Lab 09/09/15 1025 09/12/15 1340  AST 25 26  ALT 11* 12*  ALKPHOS 90 58  BILITOT 1.6* 1.0  PROT 7.8 6.3*  ALBUMIN 4.4 3.4*   No results for input(s): LIPASE, AMYLASE in the last 168 hours. No results for input(s): AMMONIA in the last 168 hours. CBC:  Recent Labs Lab 09/12/15 1340 09/13/15 0438 09/14/15 0409 09/15/15 0410 09/16/15 0349  WBC 4.9 4.1 5.2 4.8 6.0  NEUTROABS 3.2  --  2.9 2.7 3.9  HGB 11.7* 9.8* 10.8* 10.1* 10.8*  HCT 37.5* 30.7* 33.6* 31.5* 33.8*  MCV 87.3 86.0 85.7 85.2 84.6  PLT 120* 111* 126* 119* 143*   Cardiac Enzymes:  Recent Labs Lab 09/09/15 1025 09/12/15 1340 09/12/15 1914 09/13/15 0438  TROPONINI 0.05* 0.05* 0.05* 0.05*   BNP: BNP (last 3 results) No results for input(s): BNP in the last 8760 hours.  ProBNP (last 3 results) No results for input(s): PROBNP in the last 8760 hours.  CBG: No results for input(s): GLUCAP in the last 168 hours.     SignedBarbette Reichmann   09/16/2015, 8:22 AM

## 2015-09-16 NOTE — Plan of Care (Signed)
Problem: Consults Goal: Nutrition Consult-if indicated Outcome: Progressing Puree diet ordered today

## 2015-09-16 NOTE — Progress Notes (Signed)
Spoke with Dr. Marva Panda re: advancing diet, new orders received.

## 2016-02-01 ENCOUNTER — Other Ambulatory Visit
Admission: RE | Admit: 2016-02-01 | Discharge: 2016-02-01 | Disposition: A | Discharge status: Home / Self Care | Payer: Medicare Other | Source: Ambulatory Visit | Attending: Family Medicine | Admitting: Family Medicine

## 2016-02-01 DIAGNOSIS — Z0000 Encounter for general adult medical examination without abnormal findings: Secondary | ICD-10-CM | POA: Insufficient documentation

## 2016-02-01 LAB — VITAMIN B12: Vitamin B-12: 565 pg/mL (ref 180–914)

## 2016-03-01 ENCOUNTER — Other Ambulatory Visit: Payer: Self-pay | Admitting: Gastroenterology

## 2016-03-01 DIAGNOSIS — R1310 Dysphagia, unspecified: Secondary | ICD-10-CM

## 2016-03-09 ENCOUNTER — Ambulatory Visit
Admission: RE | Admit: 2016-03-09 | Discharge: 2016-03-09 | Disposition: A | Discharge status: Home / Self Care | Payer: Medicare Other | Source: Ambulatory Visit | Attending: Gastroenterology | Admitting: Gastroenterology

## 2016-03-09 DIAGNOSIS — R1310 Dysphagia, unspecified: Secondary | ICD-10-CM | POA: Diagnosis not present

## 2016-03-09 DIAGNOSIS — R1312 Dysphagia, oropharyngeal phase: Secondary | ICD-10-CM

## 2016-03-09 NOTE — Therapy (Signed)
[place] [facility] DIAGNOSTIC RADIOLOGY [address] [place], [place], [address] Phone: [phone]   Fax:     Modified Barium Swallow  Patient Details  Name: Troy Gonzales MRN: [id number] Date of Birth: 1923/04/24 No Data Recorded  Encounter Date: 03/09/2016      End of Session - 03/09/16 1347    Visit Number 1   Number of Visits 1   Date for SLP Re-Evaluation 03/09/16   SLP Start Time 1247   SLP Stop Time  1340   SLP Time Calculation (min) 53 min   Activity Tolerance Patient tolerated treatment well      Past Medical History  Diagnosis Date  . A-fib (HCC)   . COPD (chronic obstructive pulmonary disease) (HCC)   . Dementia   . Stroke [facility](HCC)     No past surgical history on file.  There were no vitals filed for this visit.  Visit Diagnosis: Dysphagia, oropharyngeal phase  Dysphagia - Plan: DG OP Swallowing Func-Medicare/Speech Path, DG OP Swallowing Func-Medicare/Speech Path   Subjective: Patient behavior: (alertness, ability to follow instructions, etc.): Patient is alert and cooperative  Chief complaint: poor oral intake, rule out oropharyngeal dysphagia as major factor   Objective:  Radiological Procedure: A videoflouroscopic evaluation of oral-preparatory, reflex initiation, and pharyngeal phases of the swallow was performed; as well as a screening of the upper esophageal phase.  I. POSTURE: Upright in MBS chair  II. VIEW: Lateral  III. COMPENSATORY STRATEGIES: N/A  IV. BOLUSES ADMINISTERED:   Thin Liquid: 2 cup rim sips   Nectar-thick Liquid: 1 cup rim sip    Puree: 1 teaspoon presentation   Mechanical Soft: Deferred, patient is on a puree diet  V. RESULTS OF EVALUATION: A. ORAL PREPARATORY PHASE: (The lips, tongue, and velum are observed for strength and coordination)       **Overall Severity Rating: Minimal; slow and disorganized  B. SWALLOW INITIATION/REFLEX: (The reflex is normal if "triggered" by the time  the bolus reached the base of the tongue)  **Overall Severity Rating: Mild-moderate; triggers at valleculae for thicker consistencies and while falling from the valleculae to the pyriform sinuses with thin liquid.   Patient maintained airway protection during the delay.  C. PHARYNGEAL PHASE: (Pharyngeal function is normal if the bolus shows rapid, smooth, and continuous transit through the pharynx and there is no pharyngeal residue after the swallow)  **Overall Severity Rating: Within normal limits  D. LARYNGEAL PENETRATION: (Material entering into the laryngeal inlet/vestibule but not aspirated) None  E. ASPIRATION:  None  F. ESOPHAGEAL PHASE: (Screening of the upper esophagus) All boluses move slowly through the esophagus.  ASSESSMENT: This 82 year old man; with poor oral intake, dementia, and marked presbyesophagus; is presenting with minimal oropharyngeal dysphagia characterized by slow / disorganized oral management and delayed pharyngeal swallow initiation.  There is no significant pharyngeal residue and no observed laryngeal penetration or aspiration. All boluses are observed to move very slowly through the esophagus.  This study is similar to the barium swallow study the patient had in October.    PLAN/RECOMMENDATIONS:   A. Diet: Puree with thin liquids   B. Swallowing Precautions: Upright for meals and remain upright after meals, reflux precautions, do not over feed   C. Recommended consultation to: continue to follow up with Dr. Marva PandaSkulskie   D. Therapy recommendations N/A,    E. Results and recommendations were discussed with the patient's son immediately following the study and the final report will be routed to  the referring MD and the patient's PCP.          G-Codes - 2016/03/27 1348    Functional Assessment Tool Used MBS, clinical judgment   Functional Limitations Swallowing   Swallow Current Status (Z6109) At least 1 percent but less than 20 percent impaired, limited or  restricted   Swallow Goal Status (U0454) At least 1 percent but less than 20 percent impaired, limited or restricted   Swallow Discharge Status [phone]) At least 1 percent but less than 20 percent impaired, limited or restricted          Problem List Patient Active Problem List   Diagnosis Date Noted  . Dementia with behavioral disturbance   . Weakness 09/12/2015   Dollene Primrose, MS/CCC- SLP  Leandrew Koyanagi Mar 27, 2016, 1:48 PM  [facility] [facility] DIAGNOSTIC RADIOLOGY [address] [place], [place], [address] Phone: [phone]   Fax:     Name: Troy Gonzales MRN: [id number] Date of Birth: 12/21/1922

## 2016-04-08 ENCOUNTER — Other Ambulatory Visit
Admission: RE | Admit: 2016-04-08 | Discharge: 2016-04-08 | Disposition: A | Discharge status: Home / Self Care | Payer: Medicare Other | Source: Ambulatory Visit | Attending: Family Medicine | Admitting: Family Medicine

## 2016-04-08 DIAGNOSIS — R197 Diarrhea, unspecified: Secondary | ICD-10-CM | POA: Insufficient documentation

## 2016-04-08 LAB — C DIFFICILE QUICK SCREEN W PCR REFLEX
C DIFFICILE (CDIFF) TOXIN: NEGATIVE
C DIFFICLE (CDIFF) ANTIGEN: NEGATIVE
C Diff interpretation: NEGATIVE

## 2016-05-10 DEATH — deceased

## 2016-10-03 IMAGING — CR DG CHEST 2V
1 series · 2 of 2 positions shown · non-contrast
Comparison: 09/09/2015

CLINICAL DATA: Chest pain and shortness of breath, nausea and
vomiting, atrial fibrillation, COPD, dementia, prior stroke

EXAM:
CHEST  2 VIEW

[Series 1: dg chest 2 view · 0.14mm/px · 2 of 2 slices shown]
[im 1/2]
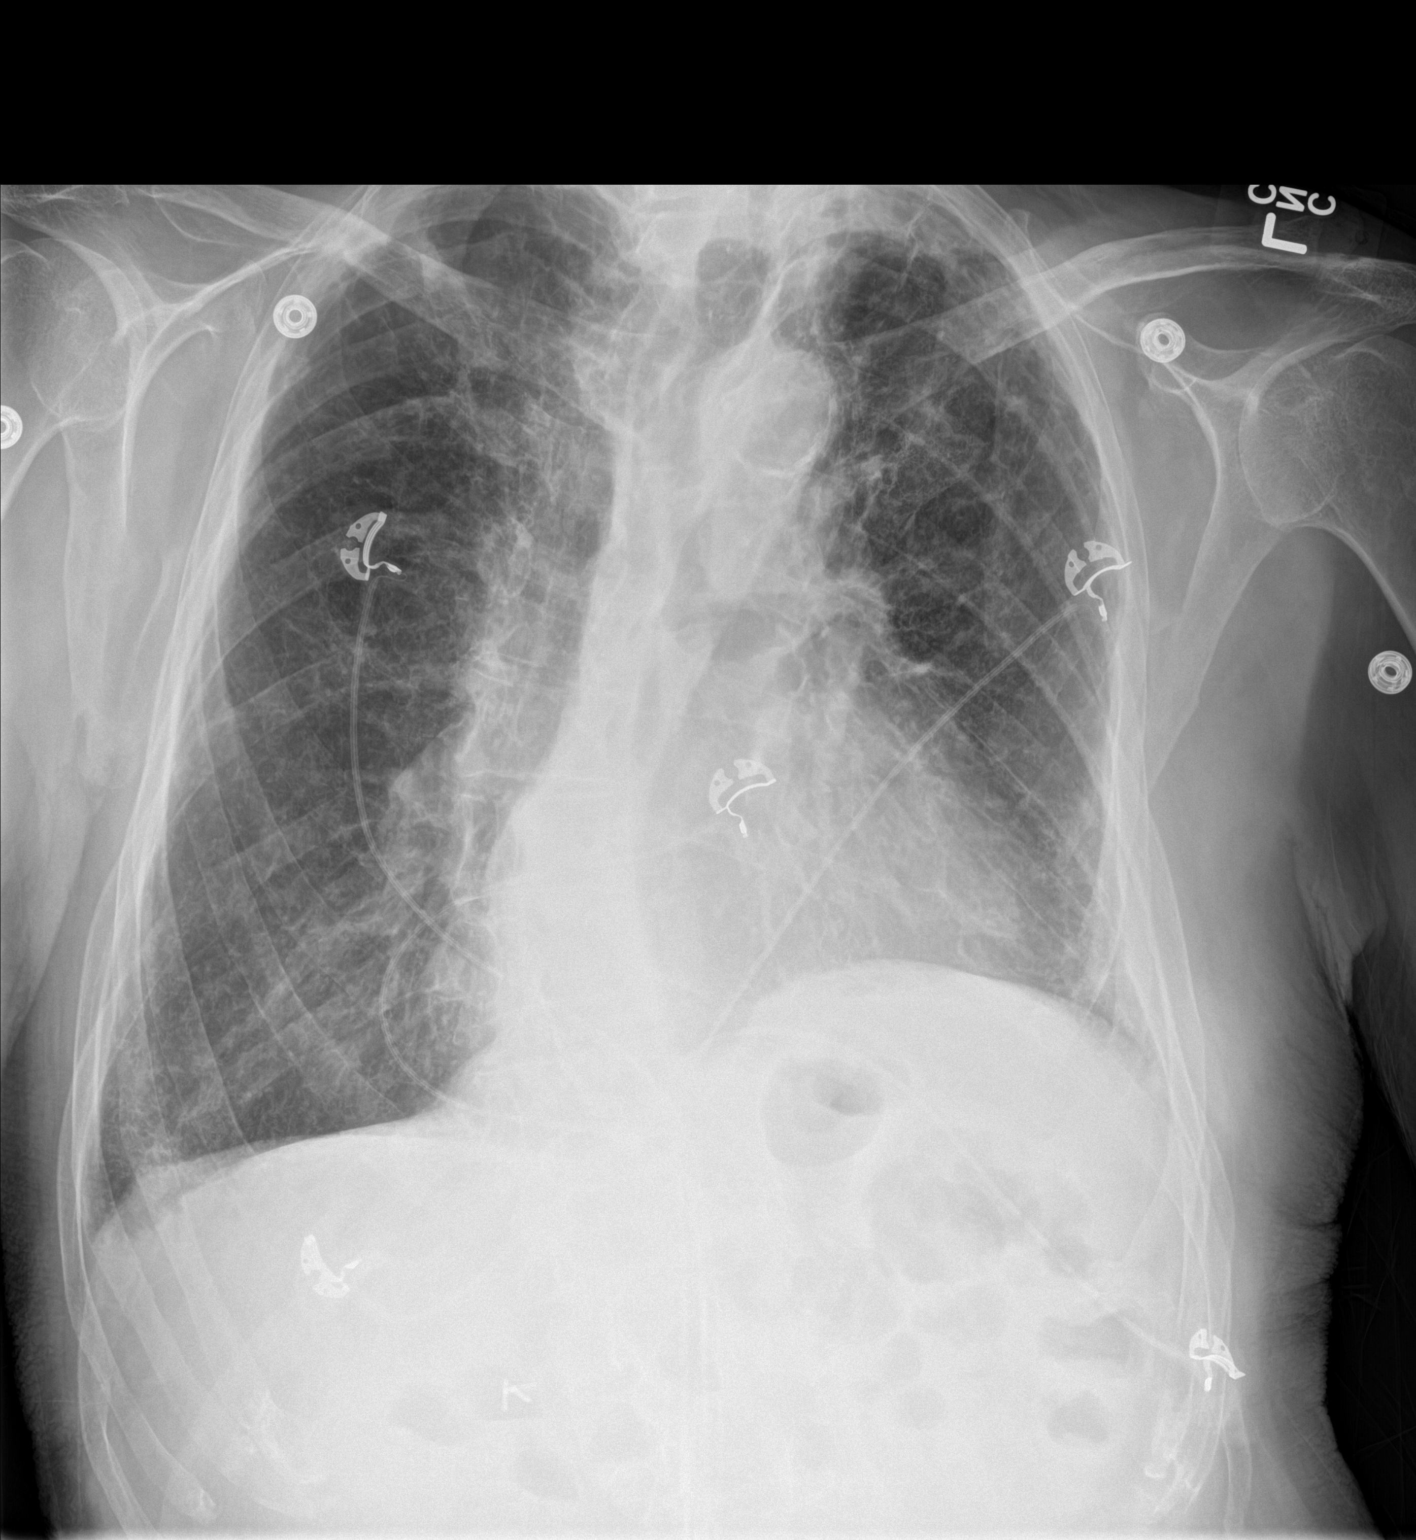
[im 2/2]
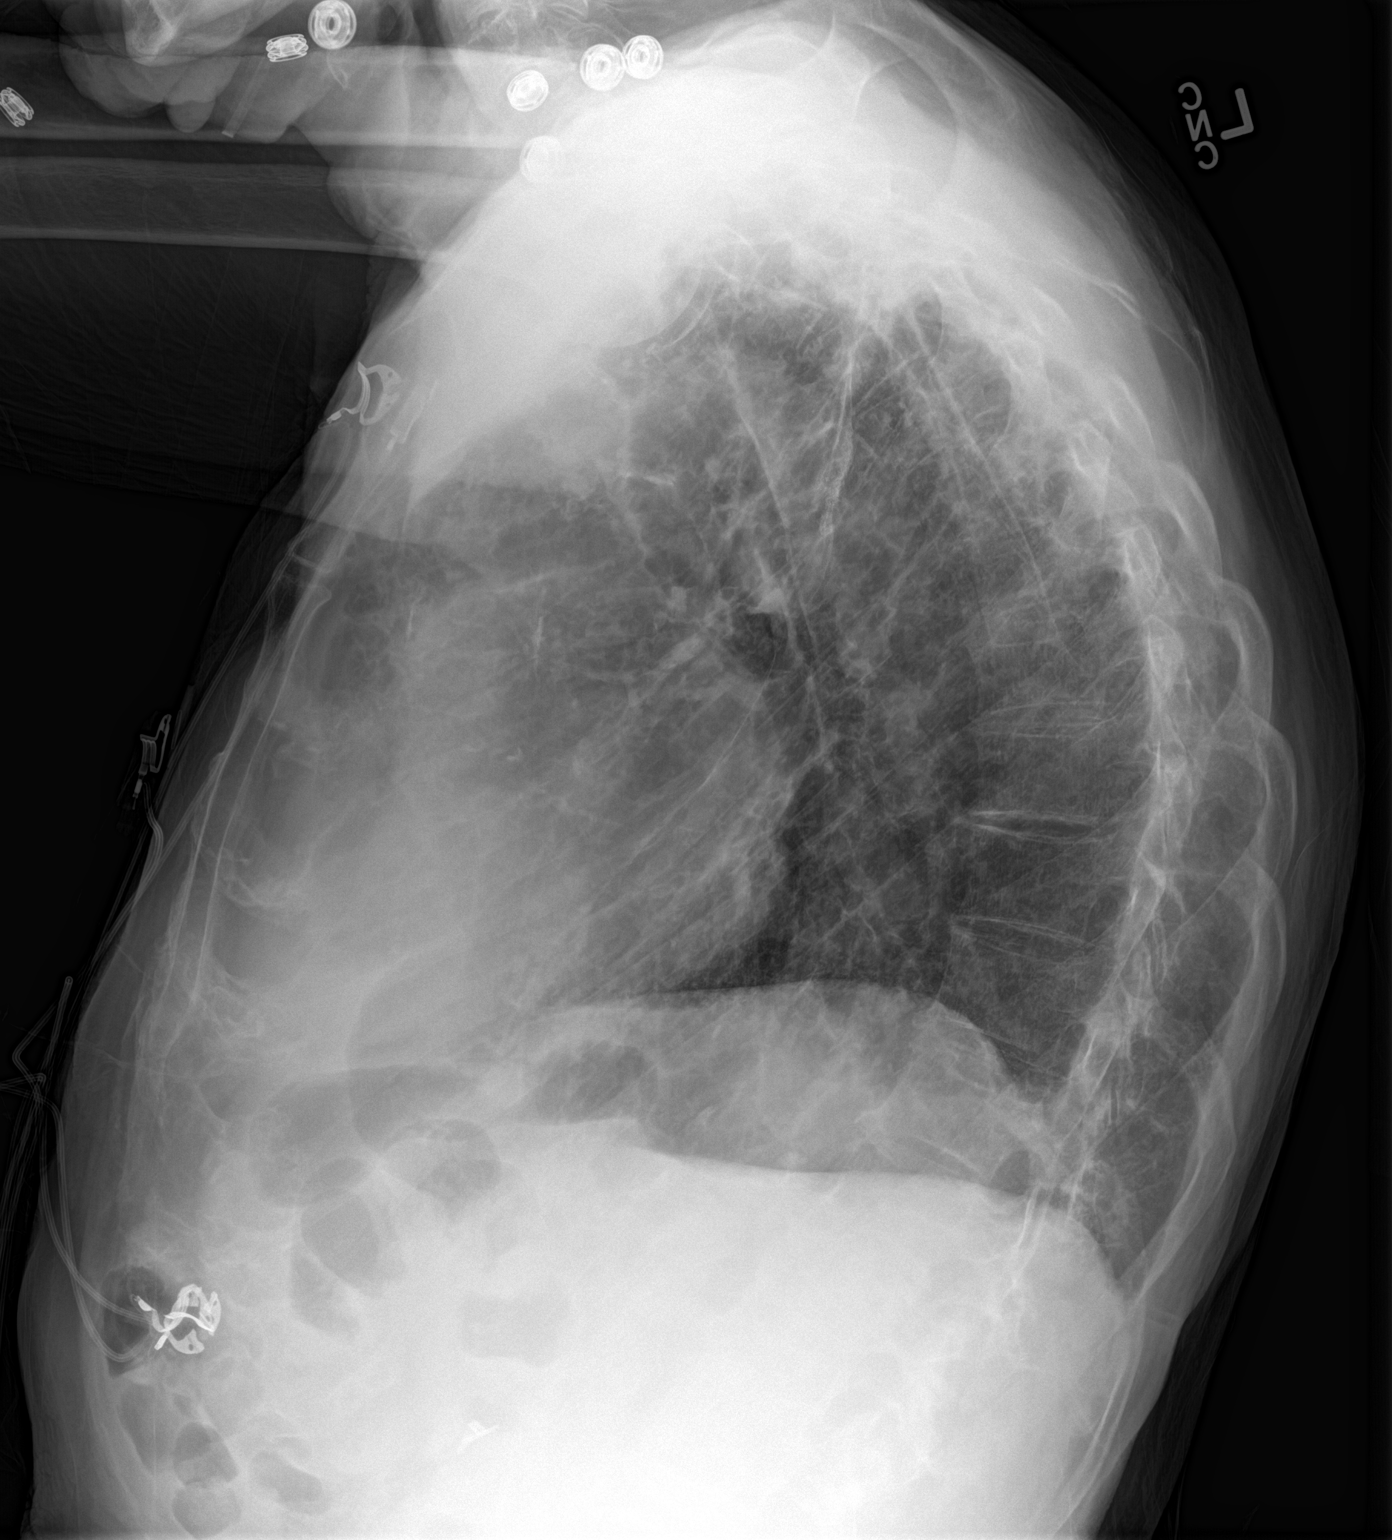

[2 of 2 positions shown; findings below may reference images not displayed]

FINDINGS: Rotated to the LEFT.

Upper normal heart size.

Atherosclerotic calcification aorta.

Mediastinal contours and pulmonary vascularity normal.

Emphysematous changes with biapical scarring greater on LEFT.

Chronic interstitial prominence slightly greater at lung bases,
stable.

No acute infiltrate, pleural effusion or pneumothorax.

Dextro convex thoracic scoliosis.

Bones demineralized.
IMPRESSION: COPD changes with chronic interstitial disease and biapical
scarring.

No acute abnormalities.

## 2016-10-03 IMAGING — CR DG ABDOMEN 2V
1 series · 2 of 2 positions shown · non-contrast
Comparison: 12/13/2014

CLINICAL DATA: Acute chest pain shortness of breath. Nausea and
vomiting. History of atrial fibrillation, COPD

EXAM:
ABDOMEN - 2 VIEW

[Series 1: dg abd 2 views · 0.14mm/px · 2 of 2 slices shown]
[im 1/2]
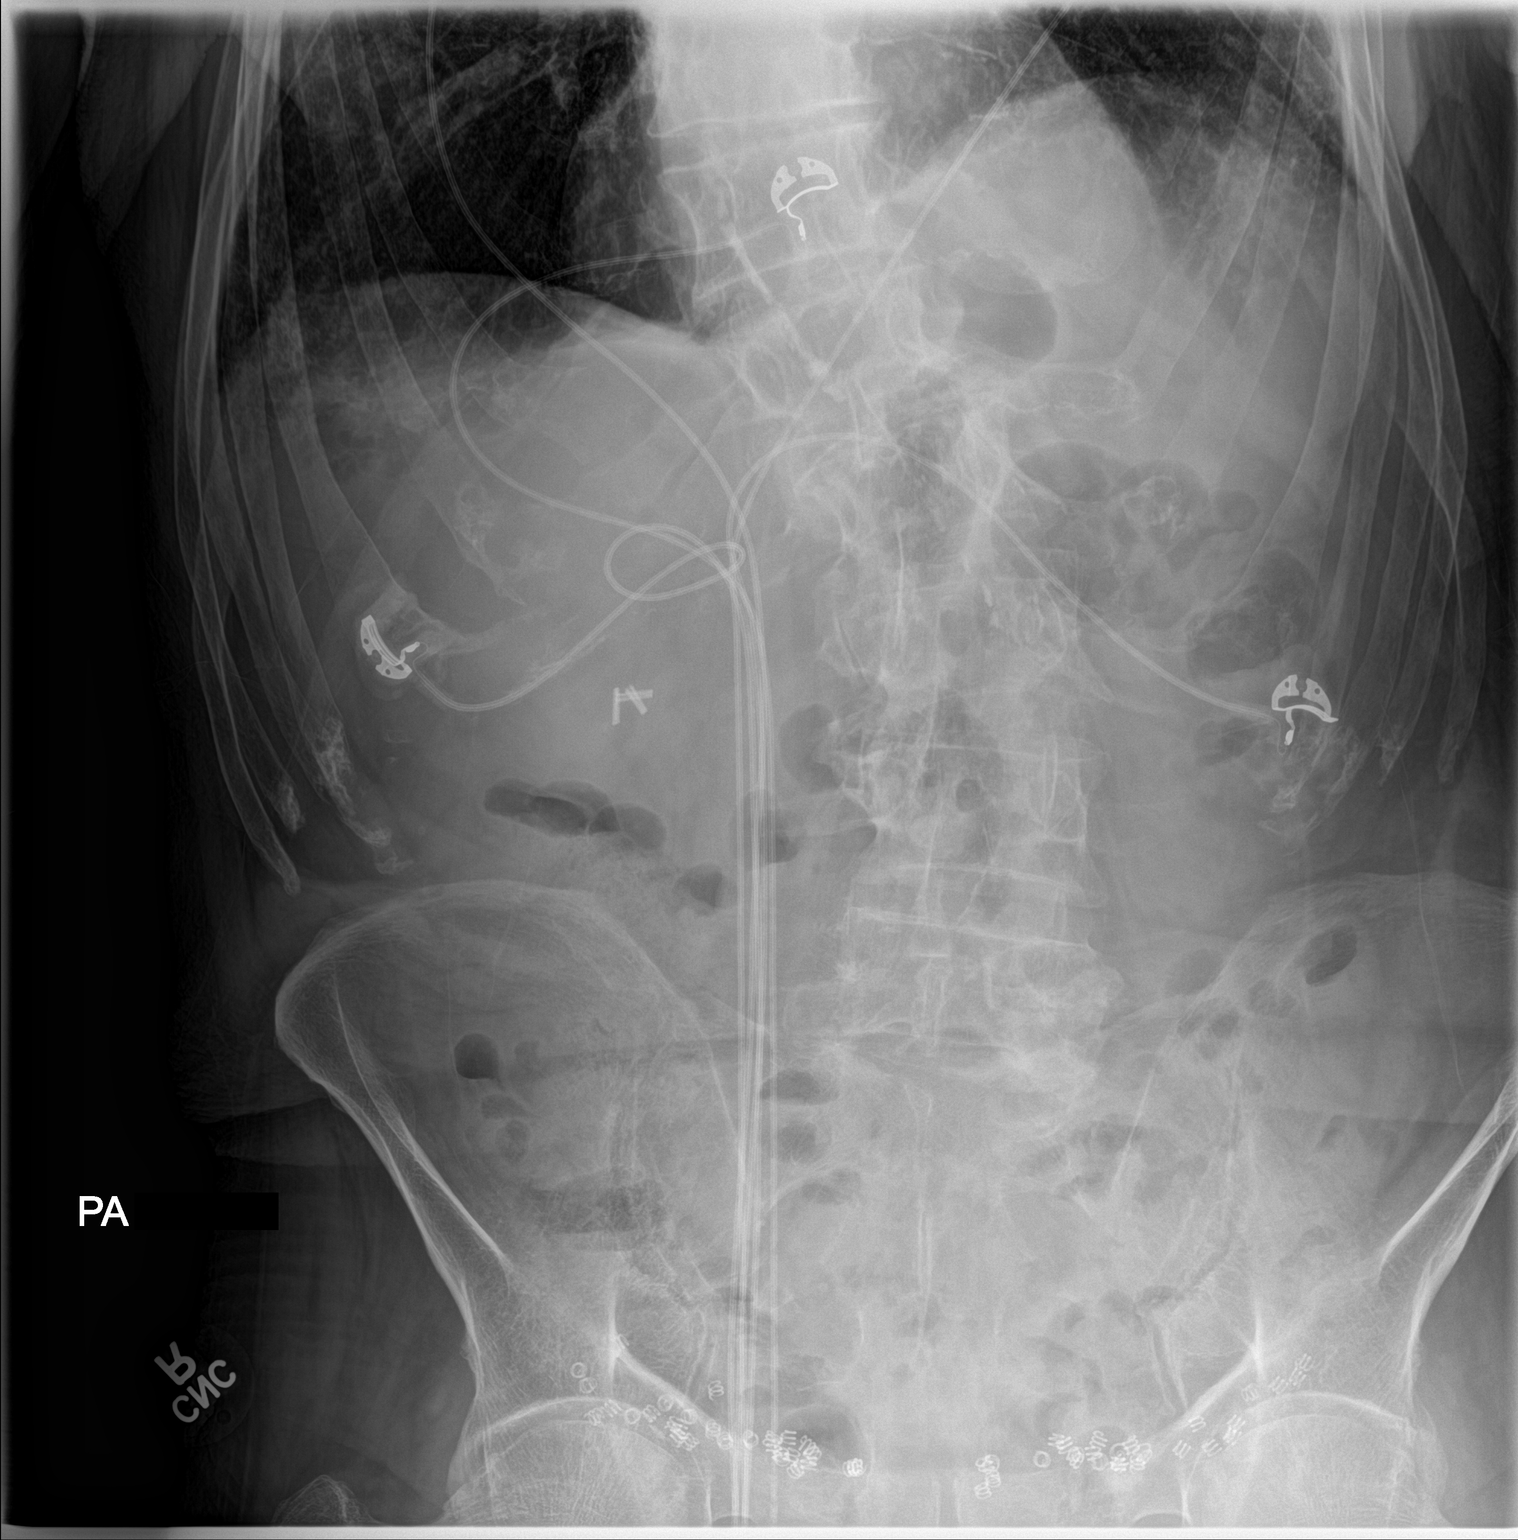
[im 2/2]
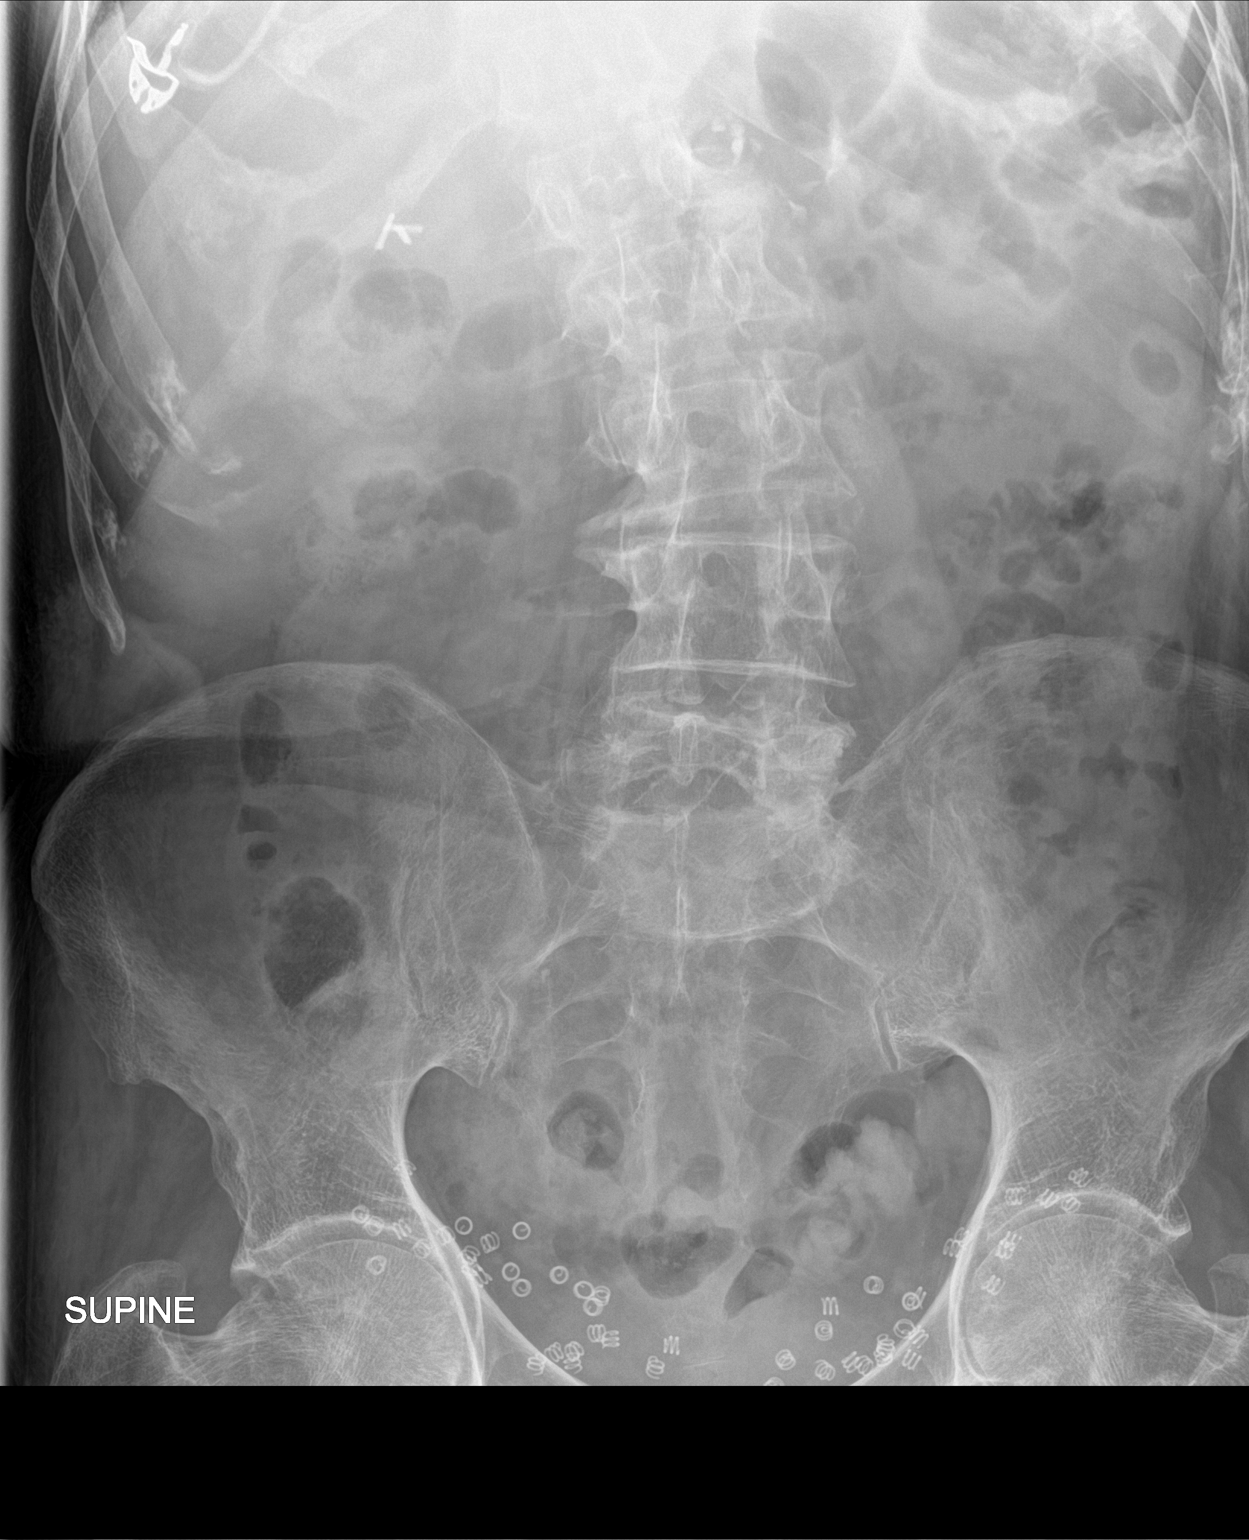

[2 of 2 positions shown; findings below may reference images not displayed]

FINDINGS: scattered air and stool throughout the bowel. Small scattered air
fluid levels on the upright exam. Negative for obstruction or ileus.
No free air. Monitor leads overlie the upper abdomen. Chronic
basilar fibrotic changes noted. Degenerative changes of the spine
with an associated scoliosis. Bones are osteopenic.
IMPRESSION: Nonobstructive bowel gas pattern.  Chronic findings as above.

## 2016-10-06 IMAGING — RF DG ESOPHAGUS
8 series · 12 of 12 positions shown · non-contrast
Comparison: Chest x-ray of September 12, 2015

CLINICAL DATA: Dysphagia, unable to swallow pills, limited history
available

EXAM:
ESOPHOGRAM/BARIUM SWALLOW
TECHNIQUE: Single contrast examination was performed using thin barium or water
soluble.
FLUOROSCOPY TIME:  Fluoroscopy Time:  2 minutes, 12 seconds
Number of Acquired Images:  8

[Series 1: fluoro_barium 2fps_bw · 0.19mm/px · 1 of 1 slices shown (1 of 8)]
[im 1/1]
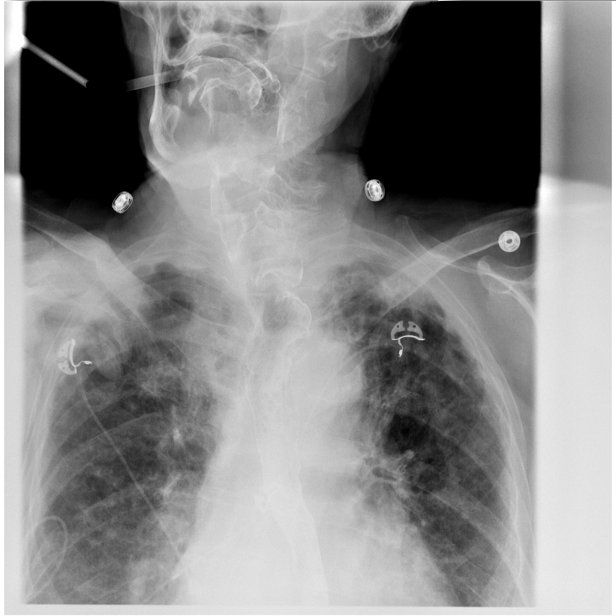

[Series 2: fluoro_barium 2fps_bw · 0.19mm/px · 1 of 1 slices shown (2 of 8)]
[im 1/1]
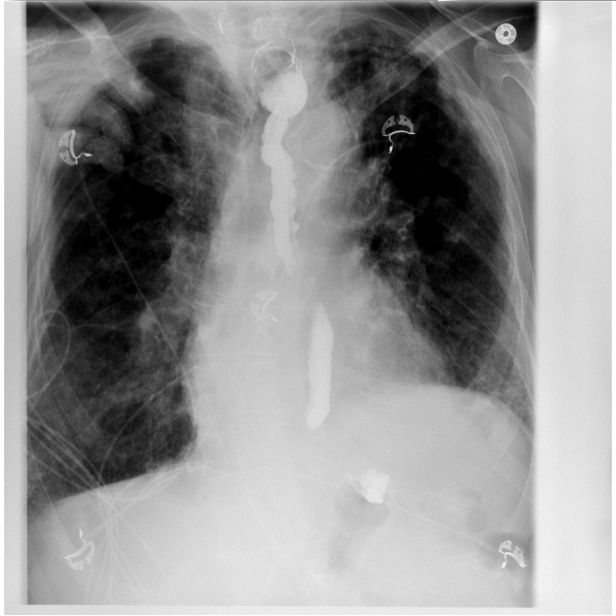

[Series 3: fluoro_barium 2fps_bw · 0.19mm/px · 1 of 1 slices shown (3 of 8)]
[im 1/1]
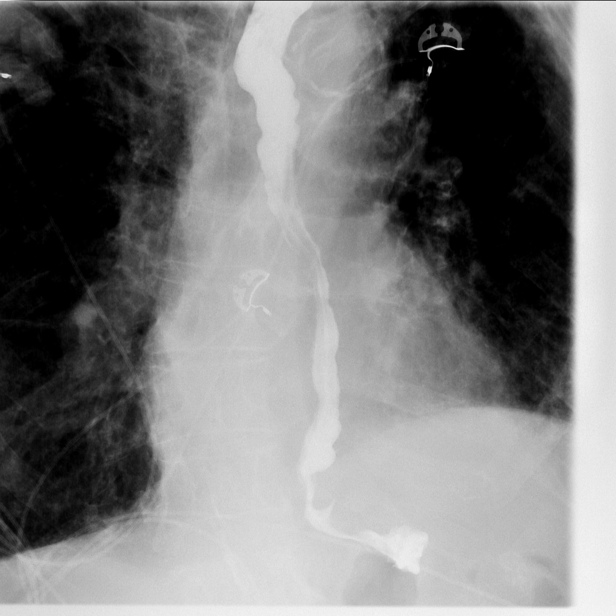

[Series 4: fluoro_barium 2fps_bw · 0.19mm/px · 2 of 2 frames shown (4 of 8)]
[frame 1/2]
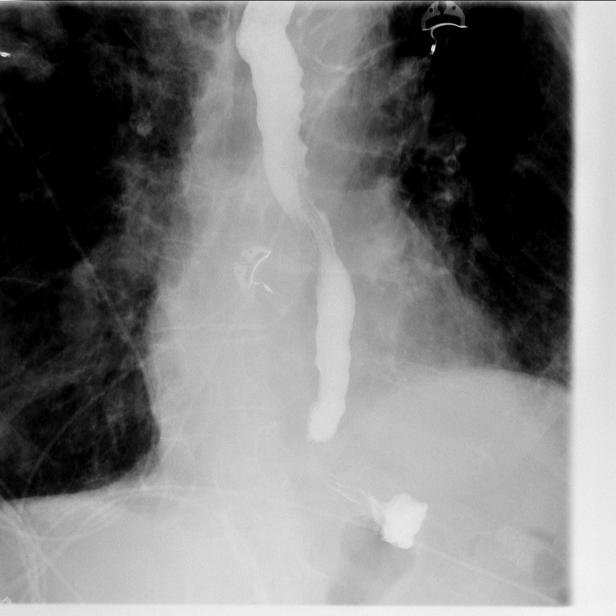
[frame 2/2]
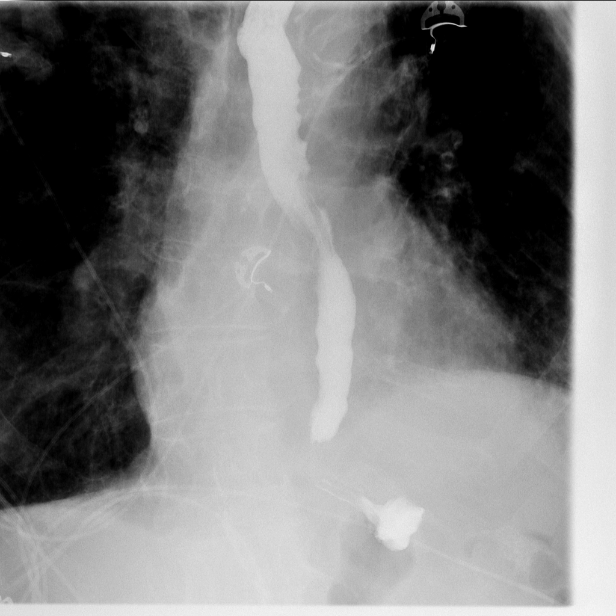

[Series 5: fluoro_barium 2fps_bw · 0.20mm/px · 2 of 2 frames shown (5 of 8)]
[frame 1/2]
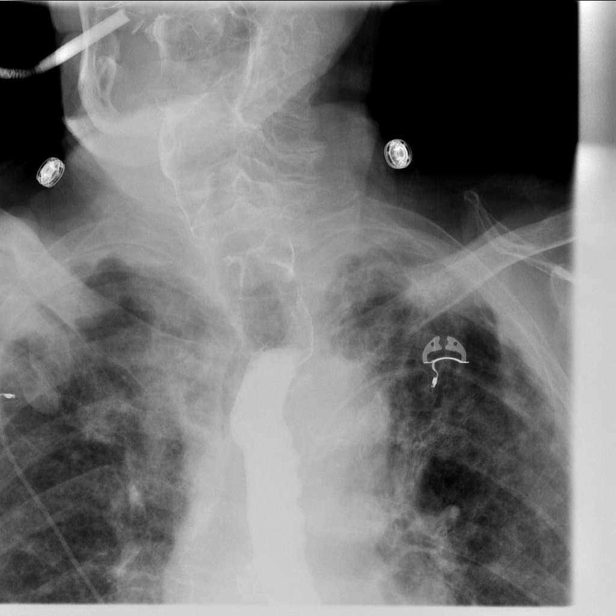
[frame 2/2]
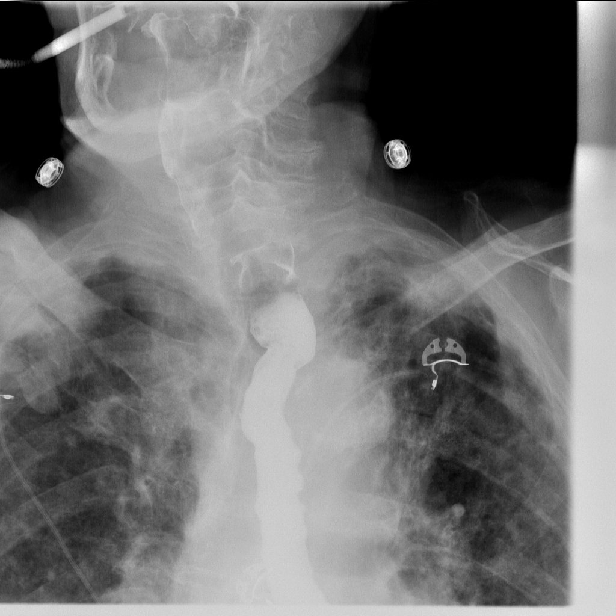

[Series 6: fluoro_barium 2fps_bw · 0.20mm/px · 2 of 2 frames shown (6 of 8)]
[frame 1/2]
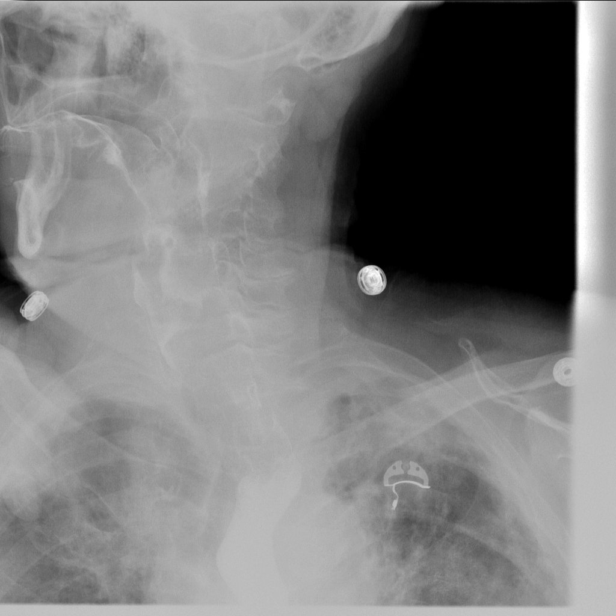
[frame 2/2]
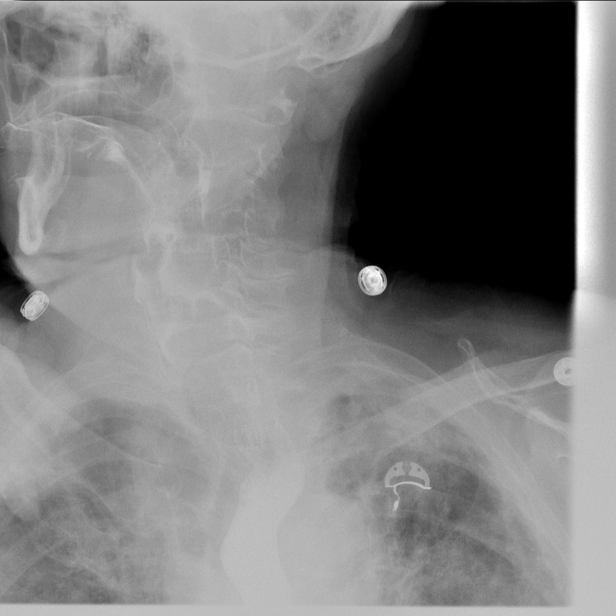

[Series 7: fluoro_barium 2fps_bw · 0.20mm/px · 2 of 2 frames shown (7 of 8)]
[frame 1/2]
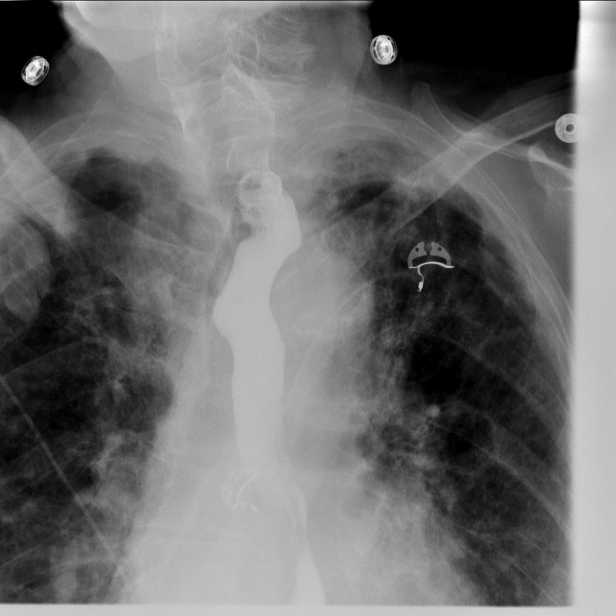
[frame 2/2]
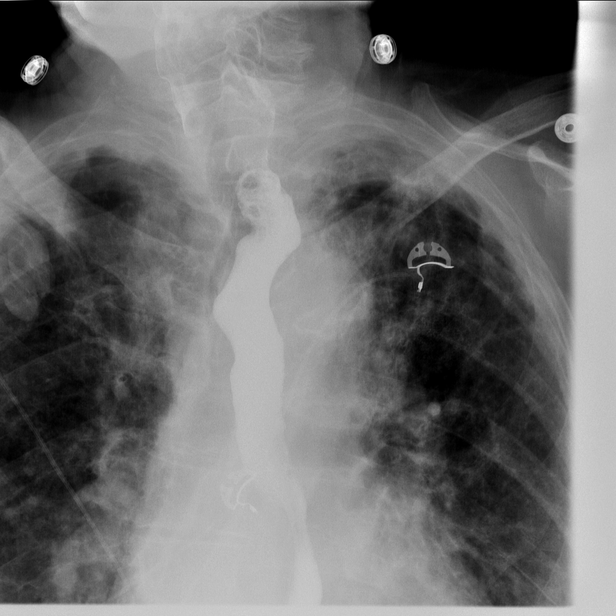

[Series 8: fluoro_barium 2fps_bw · 0.20mm/px · 1 of 1 slices shown (8 of 8)]
[im 1/1]
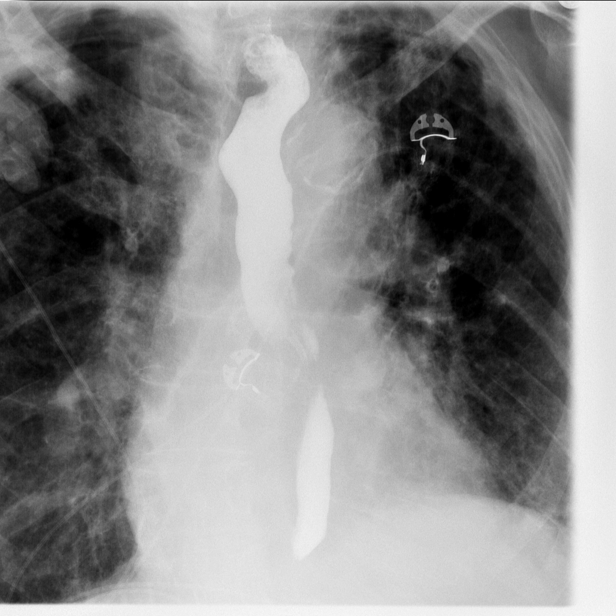

[12 of 12 positions shown; findings below may reference images not displayed]

FINDINGS: The study is quite limited due to the patient's physical condition
and limited ability to cooperate with positioning and drinking
commands. The patient did ingest thin barium. Initiation of the
swallowing maneuver peer delayed. There was no laryngeal penetration
of the barium. The thoracic esophagus distended reasonably well.
Prominent tertiary contractions were observed. In the supine
position there was marked stasis of the barium within the esophagus.
The GE junction was observed fluoroscopically and appeared to relax
appropriately. The patient was unable to mobilize the barium tablet
in the oral cavity to propel it into the esophagus. The movements of
the tongue did not appear purposeful. The tablet gradually dissolved
in the oral cavity.
IMPRESSION: 1. This is a very limited study. There are changes of
presbyesophagus without evidence of a fixed stricture. No reflux was
observed. There is stasis of the barium when the patient is in the
supine position.
2. The patient was unable to mobilize the barium tablet in the mouth
to propel it into the esophagus.
